# Patient Record
Sex: Female | Born: 1995 | Race: White | Hispanic: No | Marital: Single | Smoking: Never smoker
Health system: Southern US, Academic
[De-identification: ages and names within clinical notes are randomized; demographics above are authoritative.]

## PROBLEM LIST (undated history)

## (undated) DIAGNOSIS — E669 Obesity, unspecified: Secondary | ICD-10-CM

## (undated) DIAGNOSIS — R569 Unspecified convulsions: Secondary | ICD-10-CM

## (undated) DIAGNOSIS — K760 Fatty (change of) liver, not elsewhere classified: Secondary | ICD-10-CM

## (undated) DIAGNOSIS — E119 Type 2 diabetes mellitus without complications: Secondary | ICD-10-CM

## (undated) DIAGNOSIS — R7303 Prediabetes: Secondary | ICD-10-CM

## (undated) DIAGNOSIS — K829 Disease of gallbladder, unspecified: Secondary | ICD-10-CM

## (undated) HISTORY — DX: Unspecified convulsions: R56.9

## (undated) HISTORY — DX: Fatty (change of) liver, not elsewhere classified: K76.0

## (undated) HISTORY — DX: Obesity, unspecified: E66.9

## (undated) HISTORY — PX: HX TYMPANOSTOMY: SHX32

## (undated) HISTORY — PX: HX KNEE SURGERY: 2100001320

## (undated) HISTORY — PX: SLEEVE GASTROPLASTY: SHX1101

## (undated) HISTORY — PX: HX LAP CHOLECYSTECTOMY: SHX56

## (undated) HISTORY — PX: HX GALL BLADDER SURGERY/CHOLE: SHX55

## (undated) HISTORY — PX: HX TONSILLECTOMY: SHX27

## (undated) HISTORY — DX: Prediabetes: R73.03

## (undated) HISTORY — DX: Type 2 diabetes mellitus without complications (CMS HCC): E11.9

## (undated) HISTORY — PX: HX UPPER ENDOSCOPY: 2100001144

## (undated) HISTORY — DX: Disease of gallbladder, unspecified: K82.9

---
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
## 2018-08-29 IMAGING — US EXTREMITY
1 series · 14 of 19 positions shown · non-contrast
Comparison: None.

EXAM:  EXTREMITY RT CALF
INDICATION: Localized swelling, mass and lump over right lower limb.  Tender lump in the outer calf with increase in size.
TECHNIQUE: Multiplanar sonographic images of the right calf were performed.

[Series 2: extremity · 0.06mm/px · 14 of 19 slices shown]
[im 1/19]
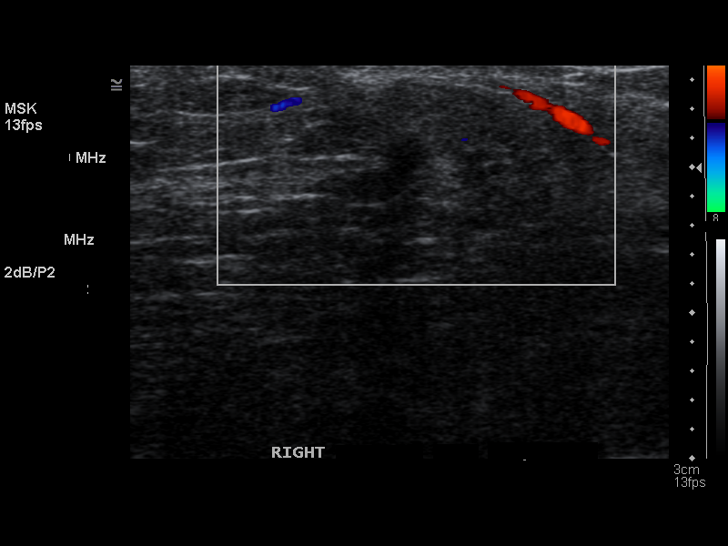
[im 3/19]
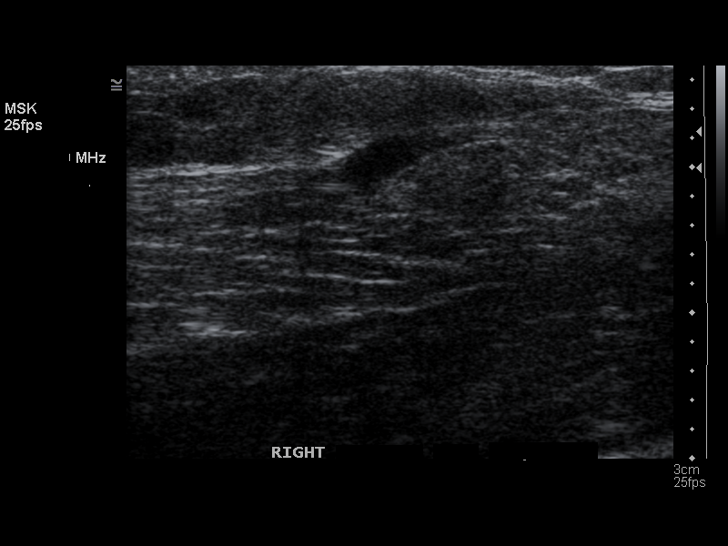
[im 4/19]
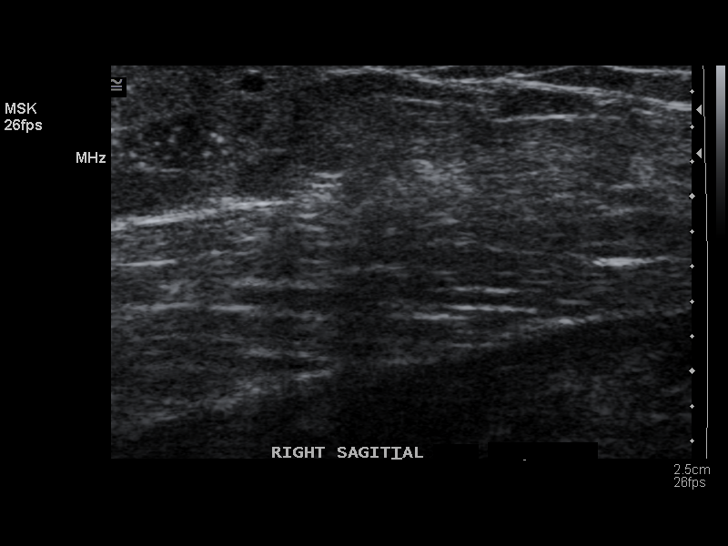
[im 5/19]
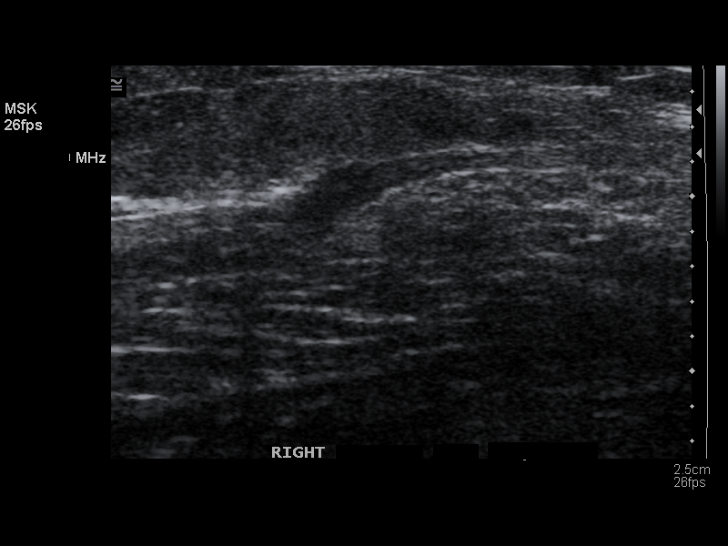
[im 7/19]
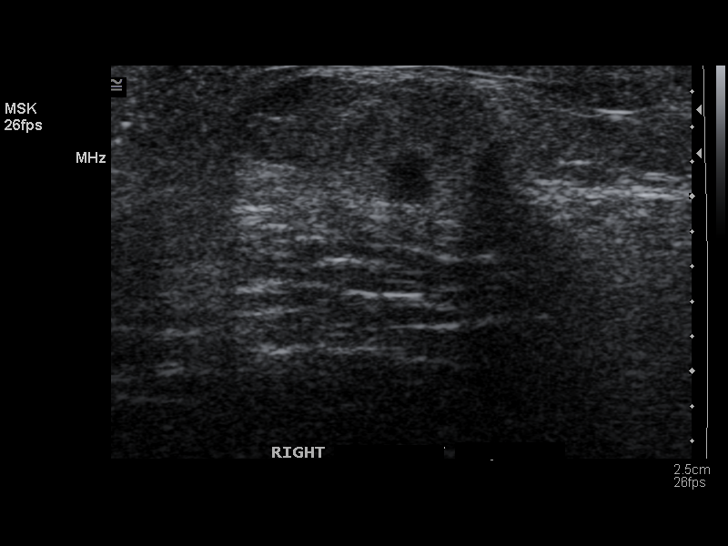
[im 8/19]
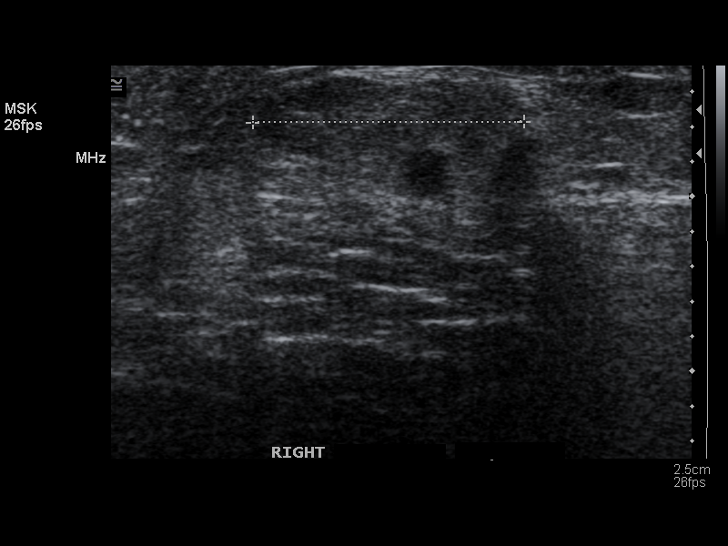
[im 9/19]
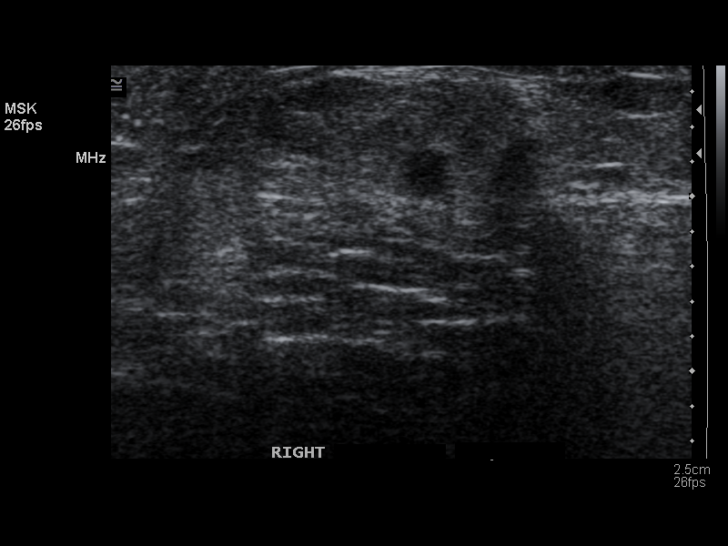
[im 11/19]
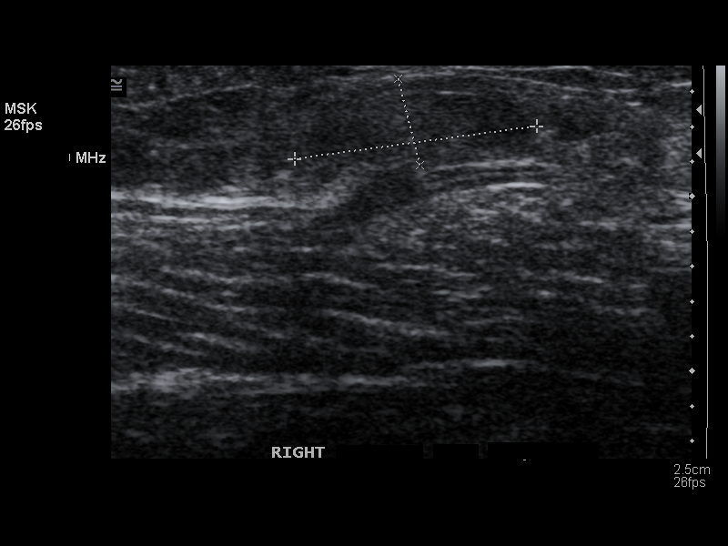
[im 12/19]
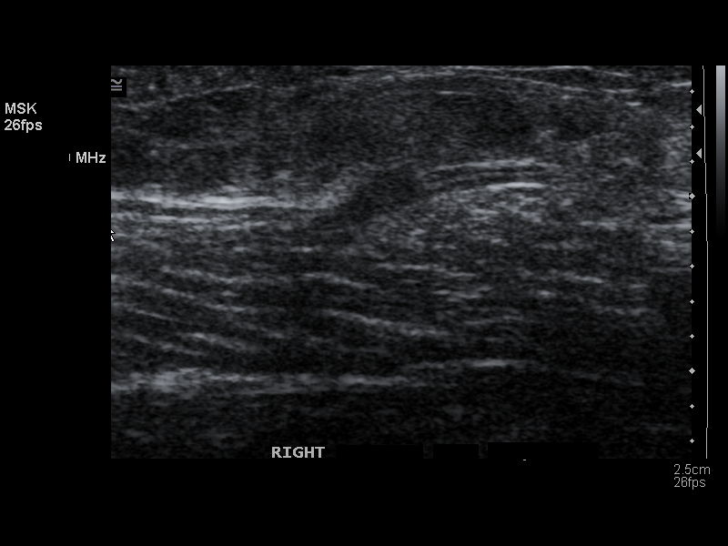
[im 13/19]
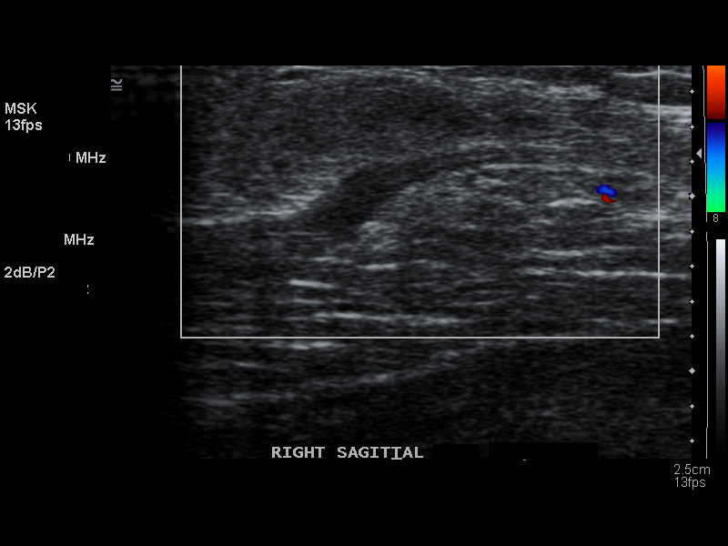
[im 15/19]
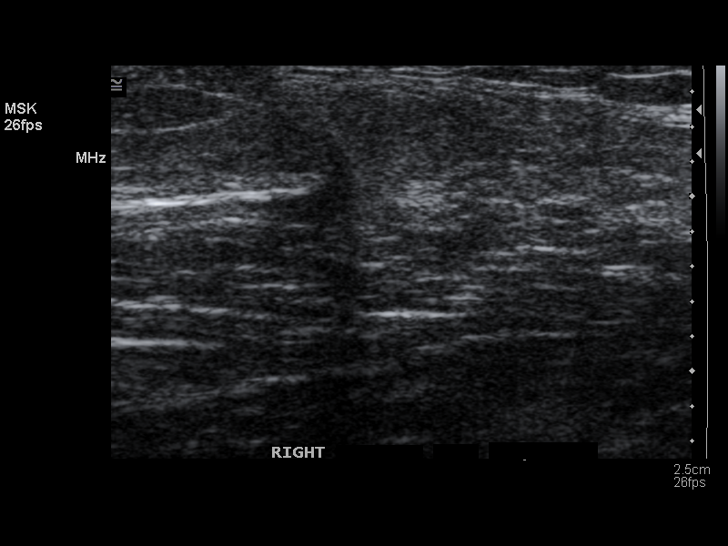
[im 16/19]
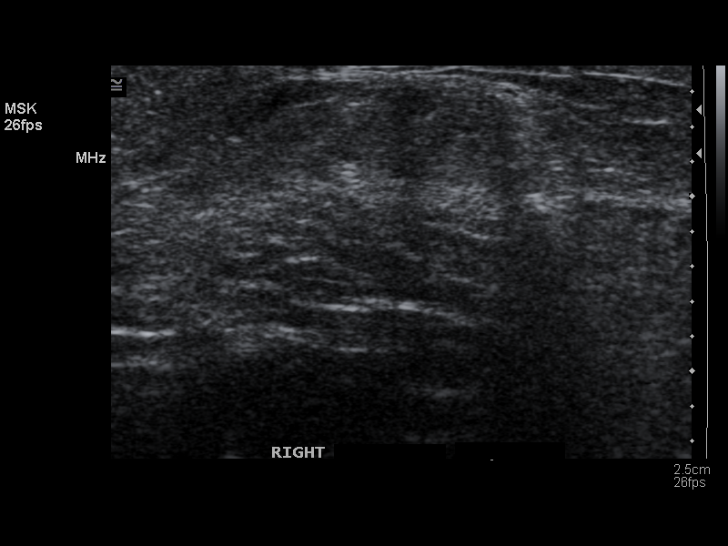
[im 17/19]
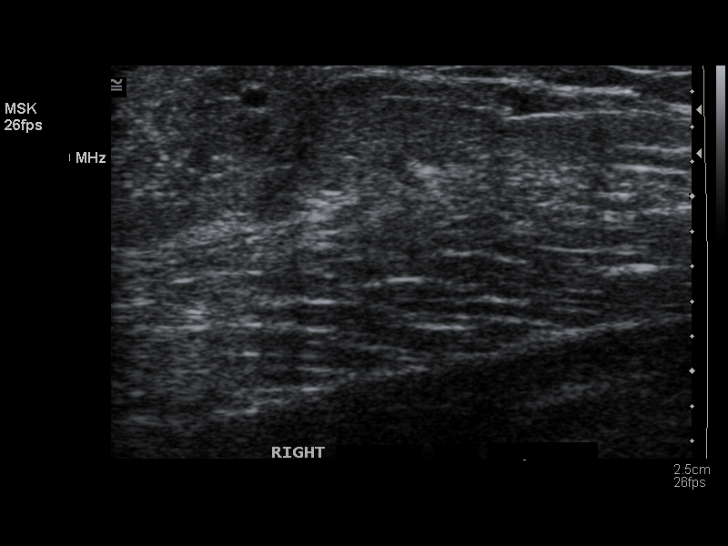
[im 19/19]
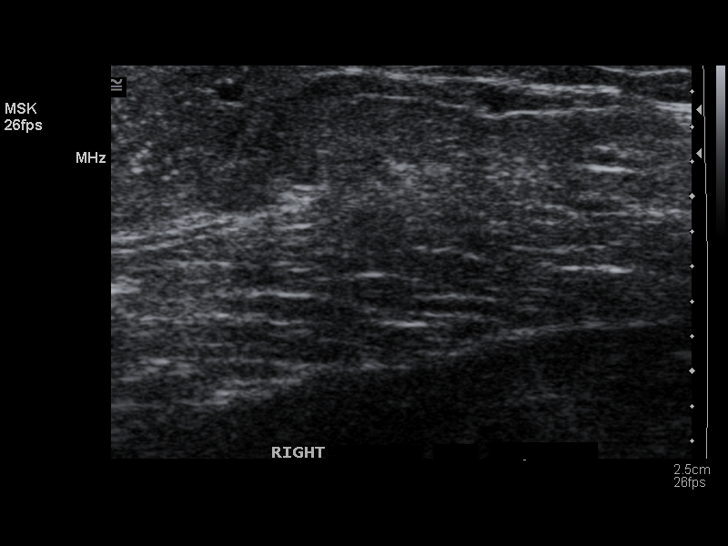

[14 of 19 positions shown; findings below may reference images not displayed]

FINDINGS: There is an isoechoic subcutaneous mass in the mid to distal calf laterally, corresponding to the palpable abnormality.  It measures 1.4 x 0.5 x 1.6 cm.  This is nonspecific and may represent a lipoma.  Lipoma was suggested on outside prior sonogram which is not available for comparison.  No other solid or cystic lesions are identified.
IMPRESSION: Nonspecific isoechoic soft tissue mass in the right calf, possibly representing a lipoma.  Correlation may be obtained with CT or MRI.

## 2019-06-24 IMAGING — MR MRI JOINT LOWER EXTREMITY WITHOUT CONTRAST RT
5 series · 40 of 40 positions shown · IV contrast (gadolinium)
Comparison: None available.

EXAM:  MRI JOINT LOWER EXTREMITY WITHOUT CONTRAST RT
INDICATION: Right knee pain, history of prior meniscal tear surgery.
TECHNIQUE: Multiplanar multisequential MRI of the right knee joint was performed without gadolinium contrast.

[Series 9: PD fat-sat · axial · right · 4.0mm · 0.59mm/px · z∈[-82,+47]mm · 8 of 30 slices shown (1 of 3)]
[im 1/30]
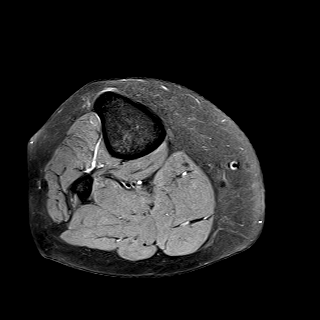
[im 5/30]
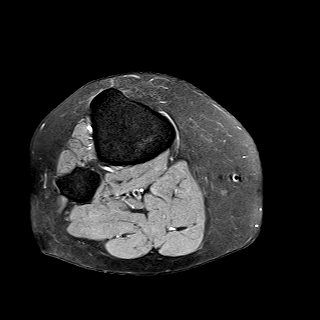
[im 9/30]
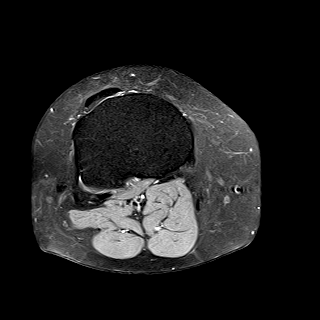
[im 13/30]
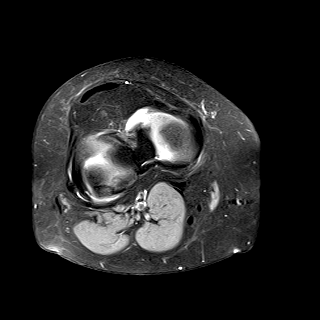
[im 17/30]
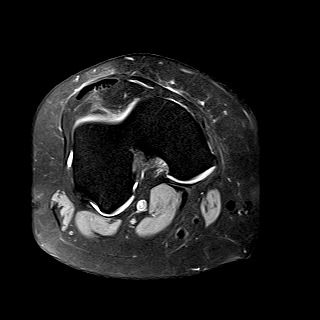
[im 21/30]
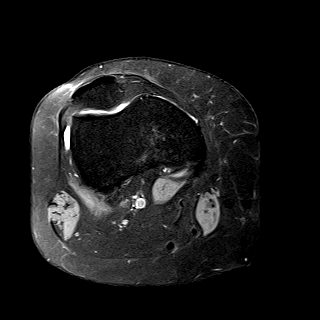
[im 25/30]
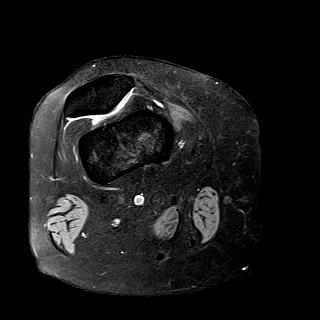
[im 30/30]
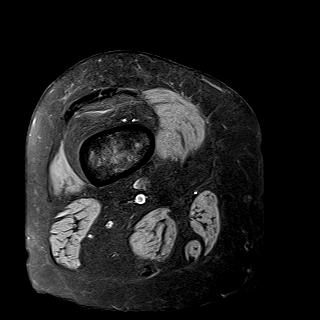

[Series 10: PD fat-sat · sagittal · right · 3.0mm · 0.56mm/px · 9 of 30 slices shown (2 of 3)]
[im 1/30]
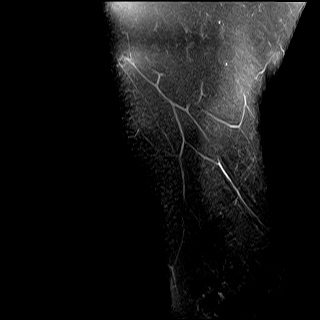
[im 4/30]
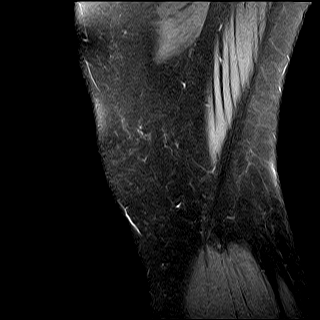
[im 8/30]
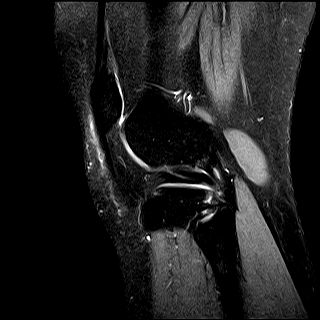
[im 11/30]
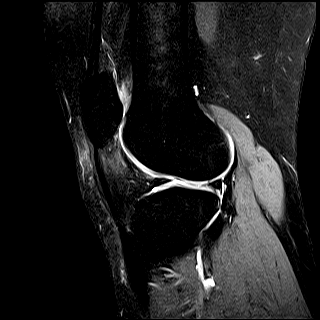
[im 15/30]
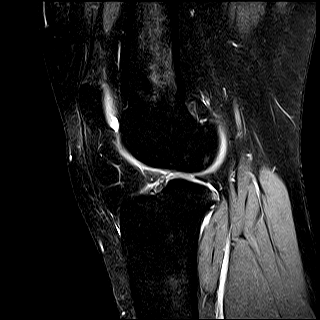
[im 19/30]
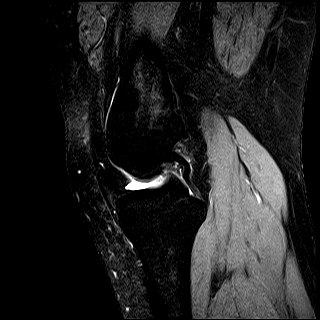
[im 22/30]
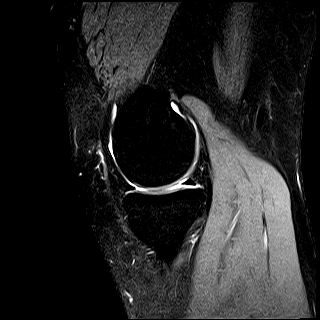
[im 26/30]
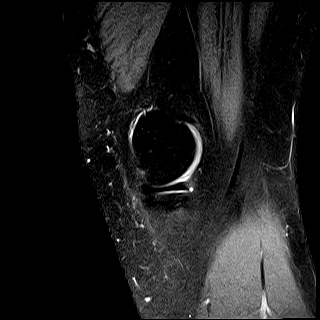
[im 30/30]
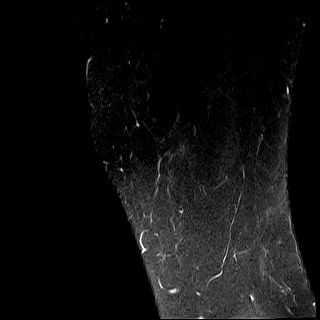

[Series 11: T1 · sagittal · right · 3.0mm · 0.47mm/px · 9 of 30 slices shown]
[im 1/30]
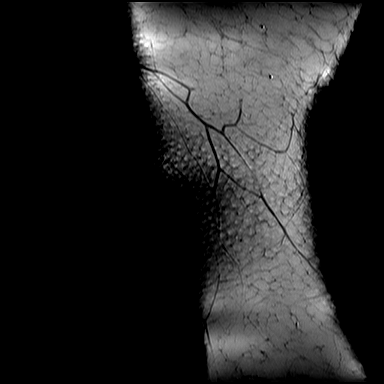
[im 4/30]
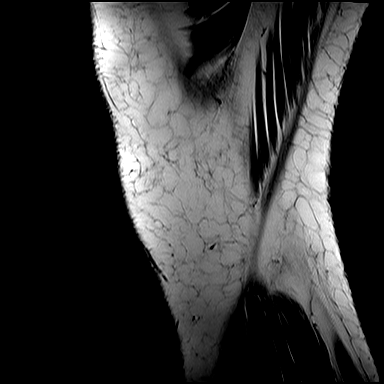
[im 8/30]
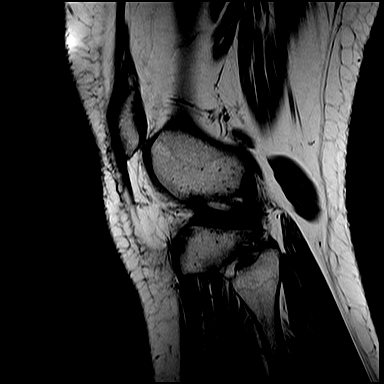
[im 11/30]
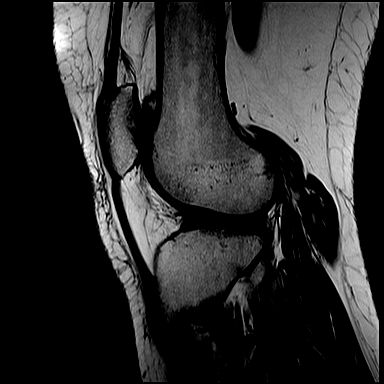
[im 15/30]
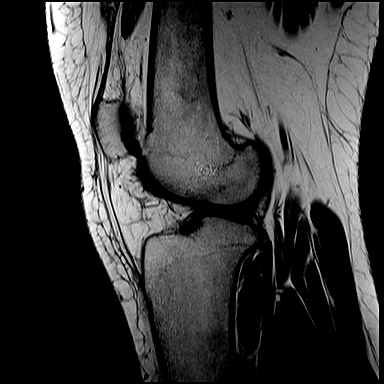
[im 19/30]
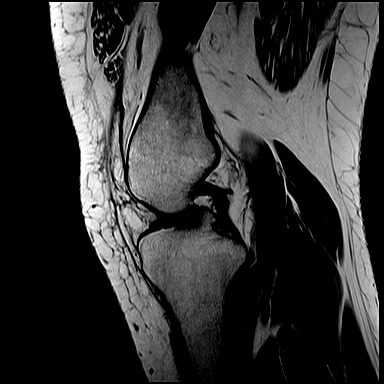
[im 22/30]
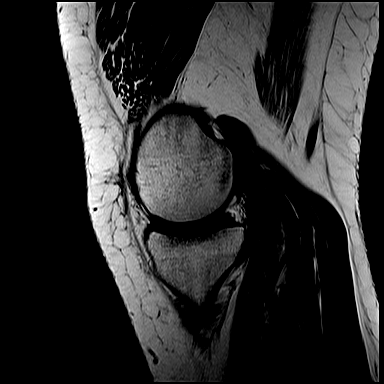
[im 26/30]
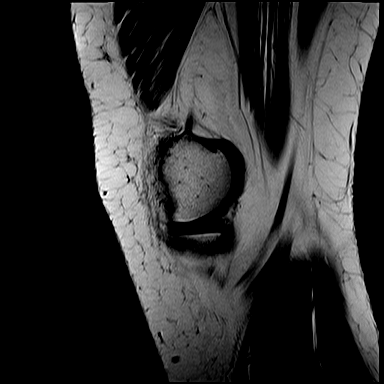
[im 30/30]
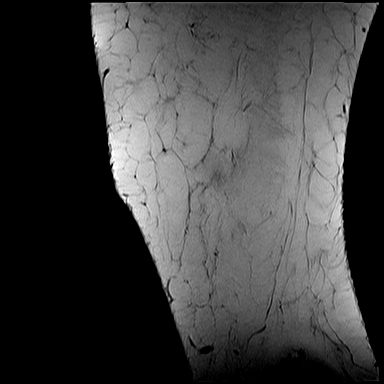

[Series 12: STIR · coronal · right · 3.0mm · 0.47mm/px · 7 of 22 slices shown]
[im 1/22]
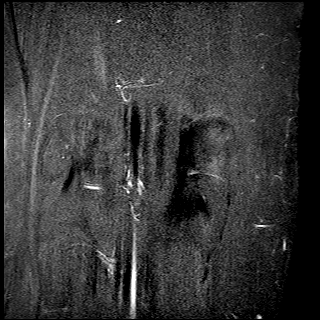
[im 4/22]
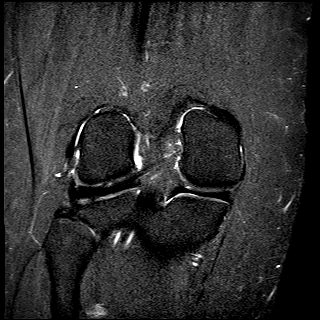
[im 8/22]
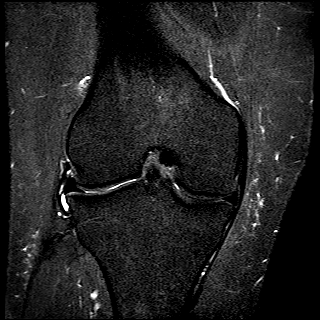
[im 11/22]
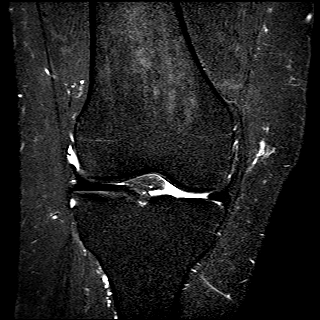
[im 15/22]
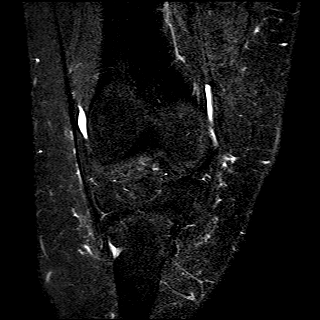
[im 18/22]
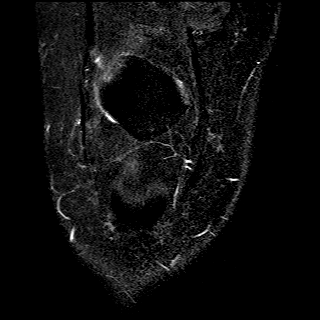
[im 22/22]
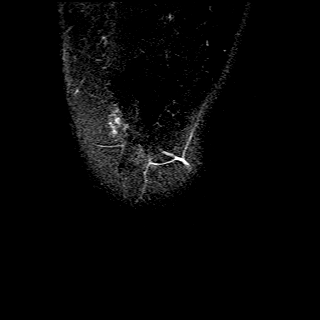

[Series 13: PD fat-sat · coronal · right · 3.0mm · 0.47mm/px · 7 of 22 slices shown (3 of 3)]
[im 1/22]
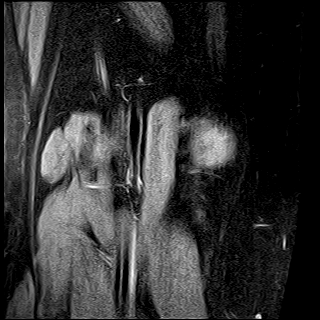
[im 4/22]
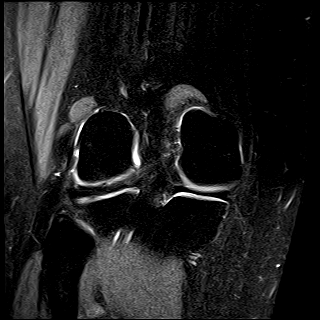
[im 8/22]
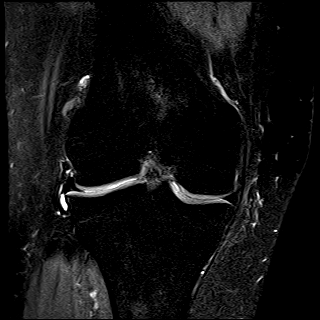
[im 11/22]
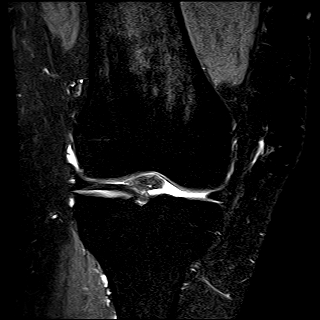
[im 15/22]
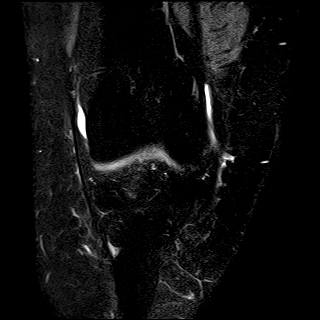
[im 18/22]
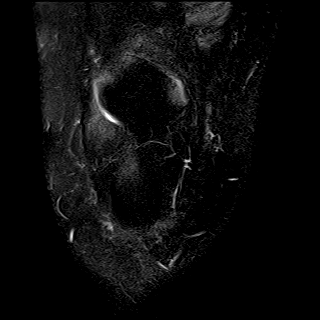
[im 22/22]
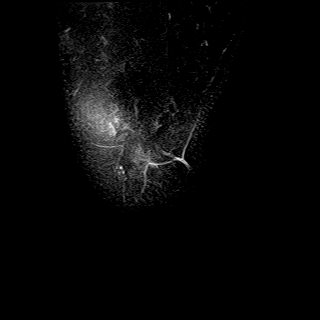

[40 of 40 positions shown; findings below may reference images not displayed]

FINDINGS: There is a subtle deformity involving the body of the lateral meniscus. Medial meniscus, cruciate and collateral ligaments are intact, within normal limits in morphology and signal intensity. Hyaline cartilage of the tibiofemoral and patellofemoral articulations is well maintained. Extensor mechanism is intact. Capsular attachments appear unremarkable. Bone marrow signal intensity is normal. There is no suprapatellar effusion or Baker's cyst.
IMPRESSION: Subtle deformity involving the body of the lateral meniscus. A remote injury and repair should be excluded clinically.

## 2020-10-05 ENCOUNTER — Encounter (INDEPENDENT_AMBULATORY_CARE_PROVIDER_SITE_OTHER): Payer: Self-pay

## 2021-04-29 IMAGING — MR MRI KNEE RT W/O CONTRAST
4 of 5 series · 24 of 40 positions shown · IV contrast (gadolinium)
Comparison: MRI dated 06/24/2019.

﻿EXAM:  19[address]   MRI KNEE RT W/O CONTRAST
INDICATION: New onset pain.  History of prior surgery.
TECHNIQUE: Multiplanar, multisequential MRI of the right knee joint was performed without gadolinium contrast.

[Series 5: PD fat-sat · axial · right · 4.0mm · 0.37mm/px · z∈[-30,+101]mm · 8 of 30 slices shown (1 of 3)]
[im 1/30]
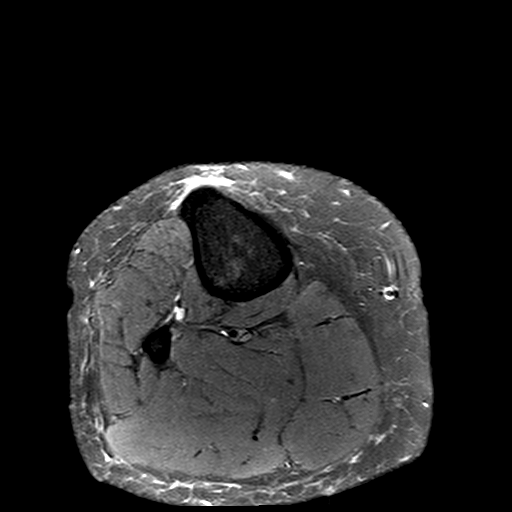
[im 5/30]
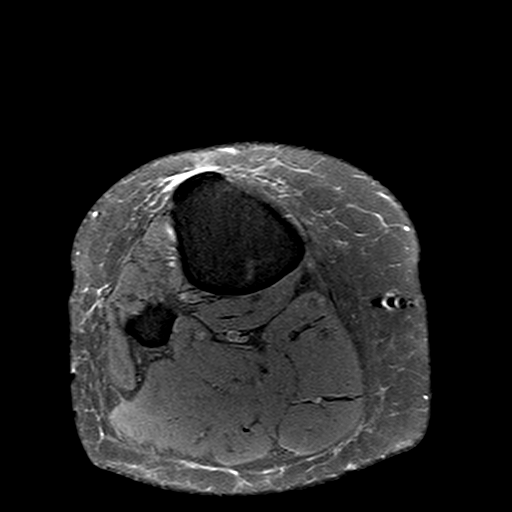
[im 9/30]
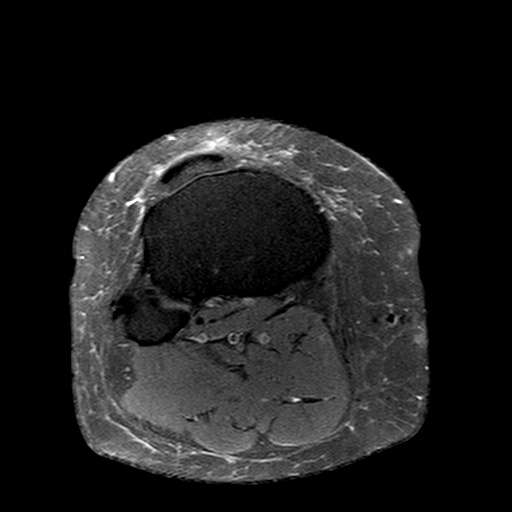
[im 13/30]
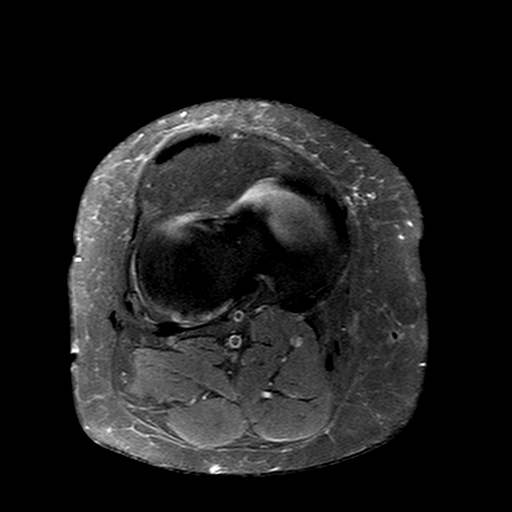
[im 17/30]
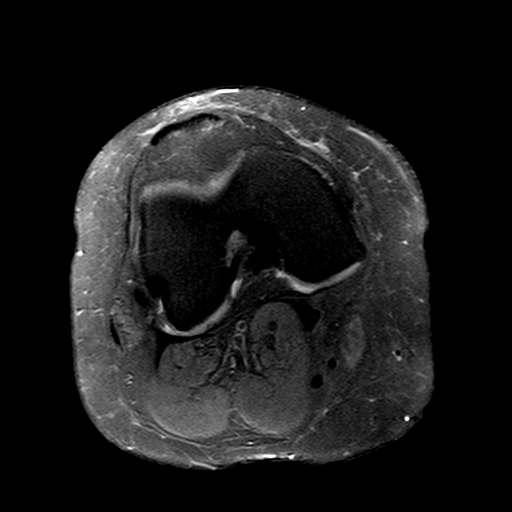
[im 21/30]
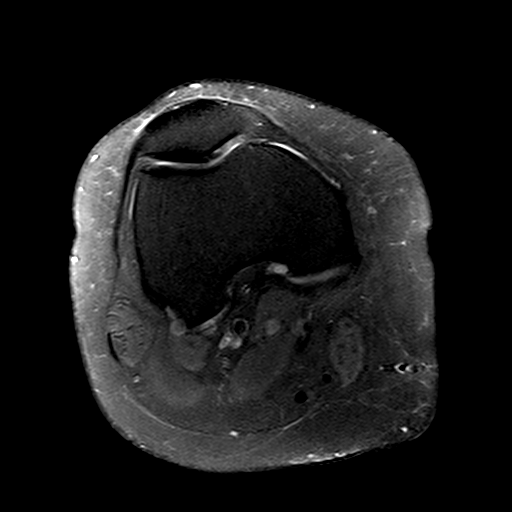
[im 25/30]
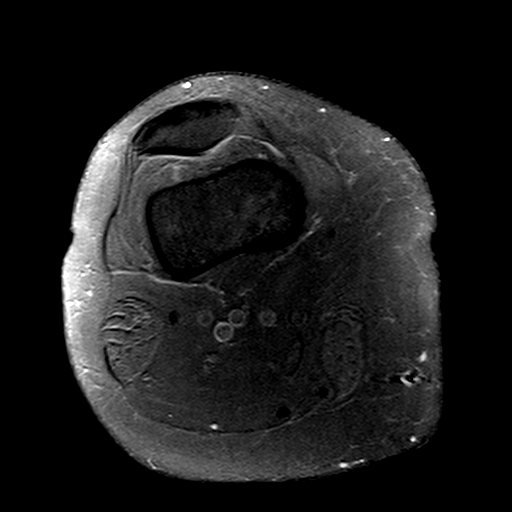
[im 30/30]
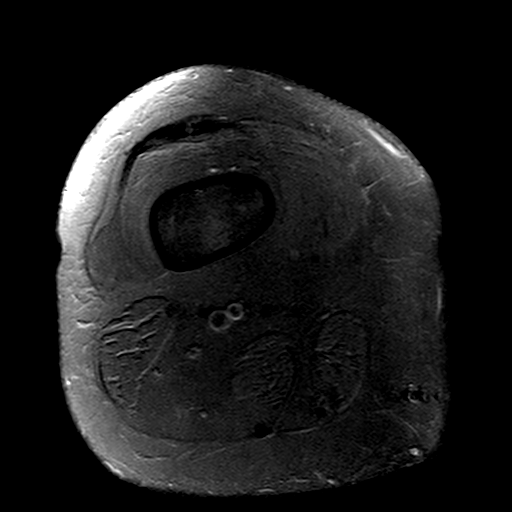

[Series 6: PD fat-sat · sagittal · right · 3.0mm · 0.31mm/px · 8 of 30 slices shown (2 of 3)]
[im 1/30]
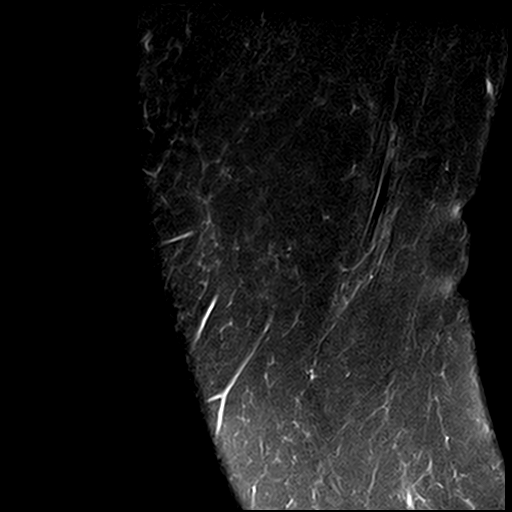
[im 5/30]
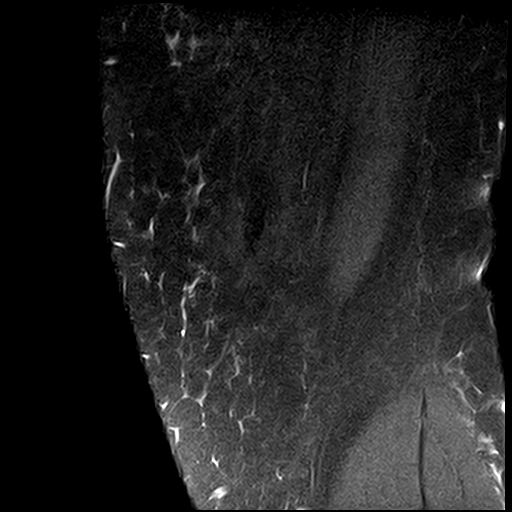
[im 9/30]
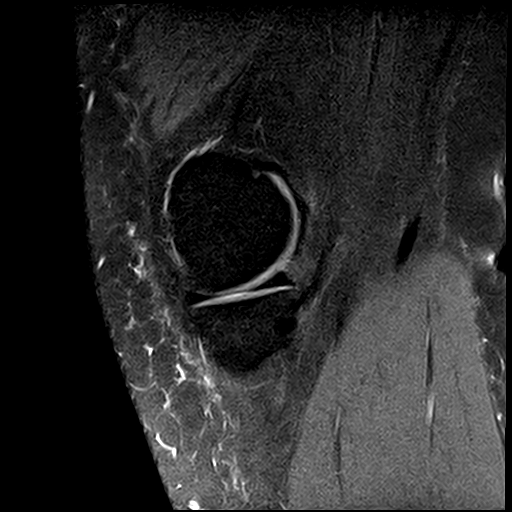
[im 13/30]
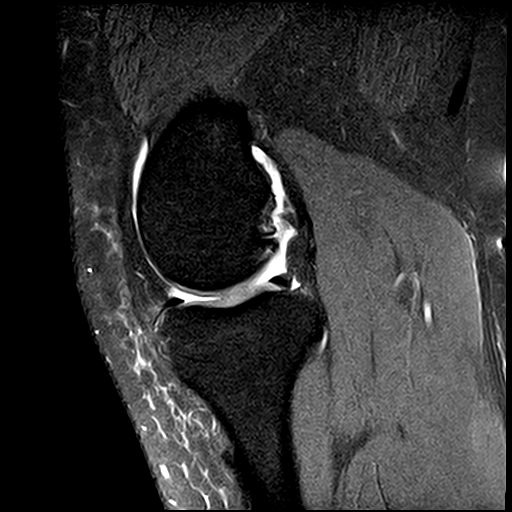
[im 17/30]
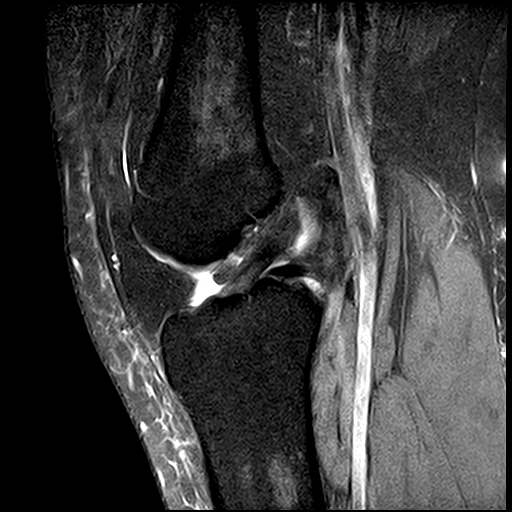
[im 21/30]
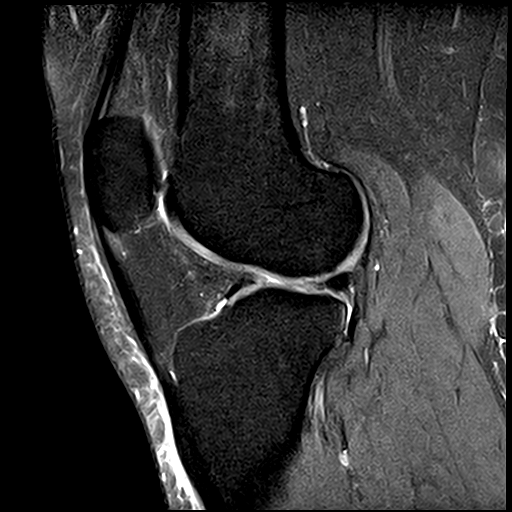
[im 25/30]
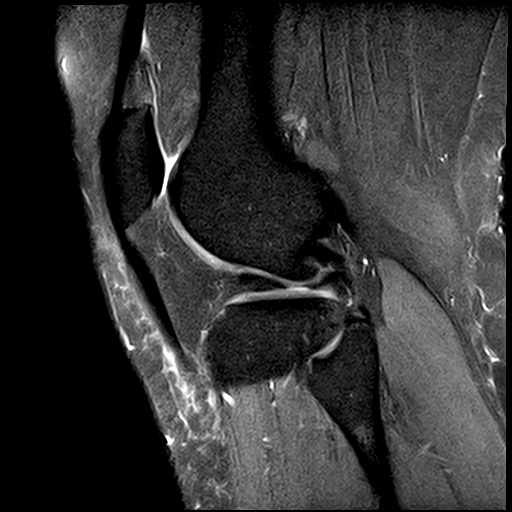
[im 30/30]
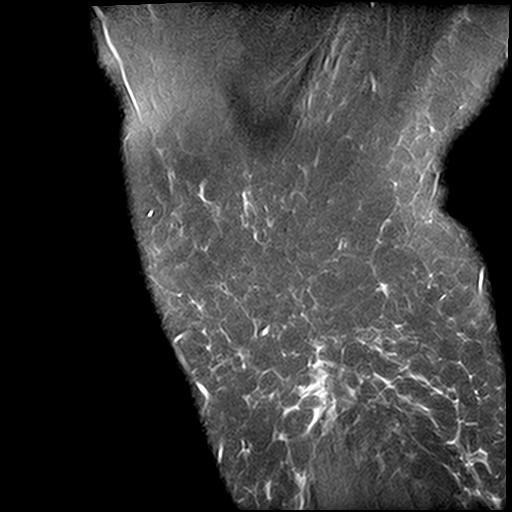

[Series 8: T1 · sagittal · right · 3.0mm · 0.42mm/px · 3 of 30 slices shown]
[im 5/30]
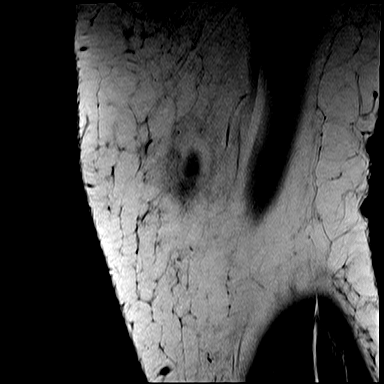
[im 17/30]
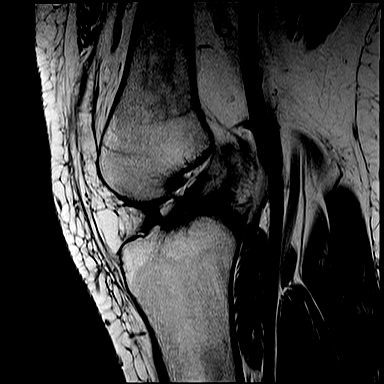
[im 25/30]
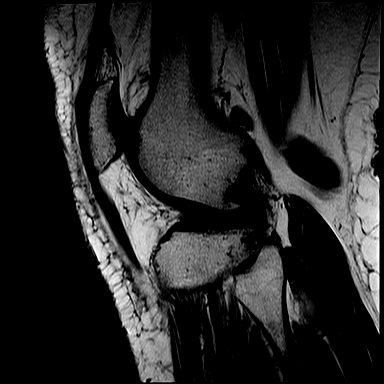

[Series 9: PD fat-sat · coronal · right · 3.0mm · 0.47mm/px · 5 of 30 slices shown (3 of 3)]
[im 1/30]
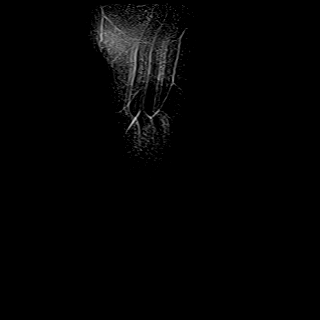
[im 5/30]
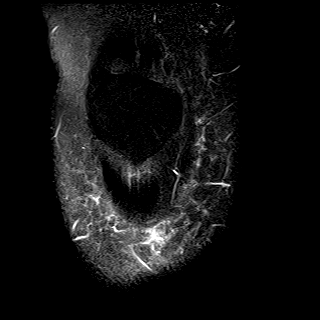
[im 9/30]
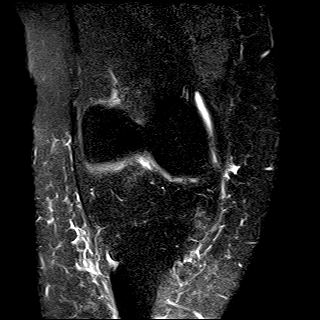
[im 17/30]
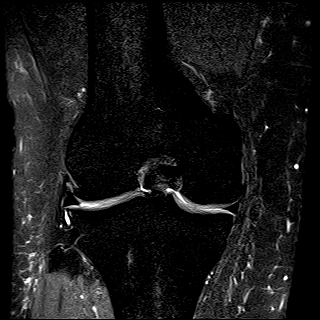
[im 25/30]
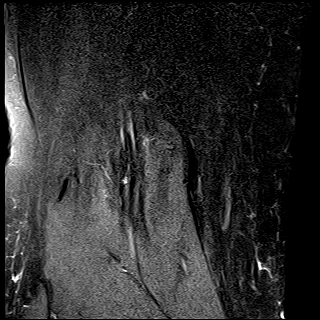

[24 of 40 positions shown; findings below may reference images not displayed]

FINDINGS: There is a horizontal tear involving the posterior horn of the lateral meniscus.  Medial meniscus, cruciate and collateral ligaments are intact, within normal limits in morphology and signal intensity.  Hyaline cartilage of the tibiofemoral and patellofemoral articulations is well-maintained.  Extensor mechanism is intact.  Capsular attachments appear unremarkable.  Bone marrow signal intensity is normal.  There is no suprapatellar effusion or Baker's cyst.
IMPRESSION: Horizontal tear involving the posterior horn of the lateral meniscus.

## 2021-10-26 ENCOUNTER — Emergency Department
Admission: EM | Admit: 2021-10-26 | Discharge: 2021-10-26 | Disposition: A | Discharge status: Home / Self Care | Payer: BC Managed Care – PPO | Attending: Family | Admitting: Family

## 2021-10-26 ENCOUNTER — Emergency Department (EMERGENCY_DEPARTMENT_HOSPITAL): Payer: BC Managed Care – PPO

## 2021-10-26 ENCOUNTER — Other Ambulatory Visit: Payer: Self-pay

## 2021-10-26 DIAGNOSIS — I10 Essential (primary) hypertension: Secondary | ICD-10-CM | POA: Insufficient documentation

## 2021-10-26 DIAGNOSIS — R519 Headache, unspecified: Secondary | ICD-10-CM

## 2021-10-26 DIAGNOSIS — Z8669 Personal history of other diseases of the nervous system and sense organs: Secondary | ICD-10-CM

## 2021-10-26 DIAGNOSIS — G510 Bell's palsy: Secondary | ICD-10-CM | POA: Insufficient documentation

## 2021-10-26 DIAGNOSIS — G43909 Migraine, unspecified, not intractable, without status migrainosus: Secondary | ICD-10-CM

## 2021-10-26 DIAGNOSIS — Z20822 Contact with and (suspected) exposure to covid-19: Secondary | ICD-10-CM | POA: Insufficient documentation

## 2021-10-26 DIAGNOSIS — H539 Unspecified visual disturbance: Secondary | ICD-10-CM

## 2021-10-26 LAB — URINALYSIS, MACRO/MICRO
BILIRUBIN: NEGATIVE mg/dL
BLOOD: NEGATIVE mg/dL
GLUCOSE: NEGATIVE mg/dL
KETONES: NEGATIVE mg/dL
LEUKOCYTES: NEGATIVE WBCs/uL
NITRITE: NEGATIVE
PH: 7 (ref 4.6–8.0)
PROTEIN: NEGATIVE mg/dL
SPECIFIC GRAVITY: 1.01 (ref 1.003–1.035)
UROBILINOGEN: 0.2 mg/dL (ref 0.2–1.0)

## 2021-10-26 LAB — COVID-19, FLU A/B, RSV RAPID BY PCR
INFLUENZA VIRUS TYPE A: NOT DETECTED
INFLUENZA VIRUS TYPE B: NOT DETECTED
RESPIRATORY SYNCTIAL VIRUS (RSV): NOT DETECTED
SARS-CoV-2: NOT DETECTED

## 2021-10-26 LAB — COMPREHENSIVE METABOLIC PANEL, NON-FASTING
ALBUMIN/GLOBULIN RATIO: 0.8 (ref 0.8–1.4)
ALBUMIN: 3.6 g/dL (ref 3.4–5.0)
ALKALINE PHOSPHATASE: 87 U/L (ref 46–116)
ALT (SGPT): 39 U/L (ref <=78)
ANION GAP: 11 mmol/L (ref 10–20)
AST (SGOT): 23 U/L (ref 15–37)
BILIRUBIN TOTAL: 0.5 mg/dL (ref 0.2–1.0)
BUN/CREA RATIO: 14
BUN: 10 mg/dL (ref 7–18)
CALCIUM, CORRECTED: 9.4 mg/dL
CALCIUM: 9 mg/dL (ref 8.5–10.1)
CHLORIDE: 102 mmol/L (ref 98–107)
CO2 TOTAL: 26 mmol/L (ref 21–32)
CREATININE: 0.71 mg/dL (ref 0.55–1.02)
ESTIMATED GFR: 122 mL/min/{1.73_m2} (ref >59)
GLOBULIN: 4.3
GLUCOSE: 88 mg/dL (ref 74–106)
OSMOLALITY, CALCULATED: 276 mOsm/kg (ref 270–290)
POTASSIUM: 4 mmol/L (ref 3.5–5.1)
PROTEIN TOTAL: 7.9 g/dL (ref 6.4–8.2)
SODIUM: 139 mmol/L (ref 136–145)

## 2021-10-26 LAB — CBC WITH DIFF
BASOPHIL #: 0.04 10*3/uL (ref 0.00–2.50)
BASOPHIL %: 0 % (ref 0–3)
EOSINOPHIL #: 0.14 10*3/uL (ref 0.00–2.40)
EOSINOPHIL %: 1 % (ref 0–7)
HCT: 38 % (ref 37.0–47.0)
HGB: 13.3 g/dL (ref 12.5–16.0)
LYMPHOCYTE #: 2.83 10*3/uL (ref 2.10–11.00)
LYMPHOCYTE %: 30 % (ref 25–45)
MCH: 29.4 pg (ref 27.0–32.0)
MCHC: 35 g/dL (ref 32.0–36.0)
MCV: 84.1 fL (ref 78.0–99.0)
MONOCYTE #: 0.54 10*3/uL (ref 0.00–4.10)
MONOCYTE %: 6 % (ref 0–12)
MPV: 6.9 fL — ABNORMAL LOW (ref 7.4–10.4)
NEUTROPHIL #: 5.79 10*3/uL (ref 4.10–29.00)
NEUTROPHIL %: 62 % (ref 40–76)
PLATELETS: 326 10*3/uL (ref 140–440)
RBC: 4.52 10*6/uL (ref 4.20–5.40)
RDW: 16.2 % — ABNORMAL HIGH (ref 11.6–14.8)
WBC: 9.3 10*3/uL (ref 4.0–10.5)

## 2021-10-26 LAB — HCG QUALITATIVE PREGNANCY, SERUM: PREGNANCY, SERUM QUALITATIVE: NEGATIVE

## 2021-10-26 LAB — C-REACTIVE PROTEIN (CRP): C-REACTIVE PROTEIN (CRP): 1.3 mg/dL — ABNORMAL HIGH (ref <=0.8)

## 2021-10-26 MED ORDER — SODIUM CHLORIDE 0.9 % IV BOLUS
1000.0000 mL | INJECTION | Status: AC
Start: 2021-10-26 — End: 2021-10-26
  Administered 2021-10-26: 1000 mL via INTRAVENOUS

## 2021-10-26 MED ORDER — PROCHLORPERAZINE EDISYLATE 10 MG/2 ML (5 MG/ML) INJECTION SOLUTION
5.0000 mg | INTRAMUSCULAR | Status: AC
Start: 2021-10-26 — End: 2021-10-26
  Administered 2021-10-26: 5 mg via INTRAVENOUS

## 2021-10-26 MED ORDER — LABETALOL 20 MG/4 ML (5 MG/ML) INTRAVENOUS SYRINGE
5.0000 mg | INJECTION | INTRAVENOUS | Status: DC
Start: 2021-10-26 — End: 2021-10-26

## 2021-10-26 MED ORDER — KETOROLAC 30 MG/ML (1 ML) INJECTION SOLUTION
INTRAMUSCULAR | Status: AC
Start: 2021-10-26 — End: 2021-10-26
  Filled 2021-10-26: qty 1

## 2021-10-26 MED ORDER — PROCHLORPERAZINE EDISYLATE 10 MG/2 ML (5 MG/ML) INJECTION SOLUTION
INTRAMUSCULAR | Status: AC
Start: 2021-10-26 — End: 2021-10-26
  Filled 2021-10-26: qty 2

## 2021-10-26 MED ORDER — LABETALOL 20 MG/4 ML (5 MG/ML) INTRAVENOUS SYRINGE
5.0000 mg | INJECTION | INTRAVENOUS | Status: AC
Start: 2021-10-26 — End: 2021-10-26
  Administered 2021-10-26: 5 mg via INTRAVENOUS

## 2021-10-26 MED ORDER — METOPROLOL SUCCINATE ER 25 MG TABLET,EXTENDED RELEASE 24 HR
25.0000 mg | ORAL_TABLET | Freq: Every day | ORAL | 4 refills | Status: DC
Start: 2021-10-26 — End: 2022-04-26

## 2021-10-26 MED ORDER — SODIUM CHLORIDE 0.9 % (FLUSH) INJECTION SYRINGE
3.0000 mL | INJECTION | Freq: Three times a day (TID) | INTRAMUSCULAR | Status: DC
Start: 2021-10-26 — End: 2021-10-26

## 2021-10-26 MED ORDER — PROCHLORPERAZINE MALEATE 10 MG TABLET
10.0000 mg | ORAL_TABLET | Freq: Four times a day (QID) | ORAL | 0 refills | Status: AC | PRN
Start: 2021-10-26 — End: 2021-11-02

## 2021-10-26 MED ORDER — SODIUM CHLORIDE 0.9 % (FLUSH) INJECTION SYRINGE
3.0000 mL | INJECTION | INTRAMUSCULAR | Status: DC | PRN
Start: 2021-10-26 — End: 2021-10-26

## 2021-10-26 MED ORDER — LABETALOL 20 MG/4 ML (5 MG/ML) INTRAVENOUS SYRINGE
INJECTION | INTRAVENOUS | Status: AC
Start: 2021-10-26 — End: 2021-10-26
  Filled 2021-10-26: qty 4

## 2021-10-26 MED ORDER — KETOROLAC 30 MG/ML (1 ML) INJECTION SOLUTION
30.0000 mg | INTRAMUSCULAR | Status: AC
Start: 2021-10-26 — End: 2021-10-26
  Administered 2021-10-26: 30 mg via INTRAVENOUS

## 2021-10-26 MED ORDER — BUTALBITAL-ACETAMINOPHEN-CAFFEINE 50 MG-300 MG-40 MG CAPSULE
1.0000 | ORAL_CAPSULE | ORAL | 0 refills | Status: DC
Start: 2021-10-26 — End: 2022-03-15

## 2021-10-26 MED ORDER — LABETALOL 20 MG/4 ML (5 MG/ML) INTRAVENOUS SYRINGE
10.0000 mg | INJECTION | INTRAVENOUS | Status: DC
Start: 2021-10-26 — End: 2021-10-26

## 2021-10-26 NOTE — Discharge Instructions (Signed)
Very important to take BP meds as directed to present strokes, heart disease, kidney failure, and early death. The BP med may help reduce  migraines as well.

## 2021-10-26 NOTE — ED Triage Notes (Signed)
Pt complains of having a headache and elevated blood pressure since Monday.

## 2021-10-26 NOTE — ED Provider Notes (Signed)
[facility], [facility] Emergency Department  ED Primary Provider Note  History of Present Illness   Chief Complaint   Patient presents with   . Headache     Arrival: The patient arrived by Car    Sabrina Morgan is a 26 y.o. female who had concerns including Headache.  Constant headache for a week. 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 BP was 240/120 this am. Out of BP meds for years. Use to be on HCTZ, lisinopril.     Review of Systems   Constitutional: No fever, chills or weakness   Skin: No rash or diaphoresis  HENT: No headaches, or congestion  Eyes: No vision changes or photophobia   Cardio: No chest pain, palpitations or leg swelling   Respiratory: No cough, wheezing or SOB  GI:  + nausea,  No vomiting or stool changes  GU:  No dysuria, hematuria, or increased frequency  MSK: No muscle aches, joint or back pain  Neuro: No seizures, LOC, numbness, tingling, or focal weakness, severe left temporal headache.   Psychiatric: No depression, SI or substance abuse  All other systems reviewed and are negative.    Historical Data   History Reviewed This Encounter:  All reviewed and noted.     Physical Exam   ED Triage Vitals [10/26/21 1255]   BP (Non-Invasive) (!) 154/98   Heart Rate 73   Respiratory Rate 18   Temperature 37 C (98.6 F)   SpO2 98 %   Weight (!) 147 kg (324 lb 1.2 oz)   Height 1.829 m (6')       Constitutional:  26 y.o. female who appears in no distress. Normal color, no cyanosis.   HENT:   Head: Normocephalic and atraumatic.   Mouth/Throat: Oropharynx is clear and moist.   Eyes: EOMI, PERRL   Neck: Trachea midline. Neck supple.  Cardiovascular: RRR, No murmurs, rubs or gallops. Intact distal pulses.  Pulmonary/Chest: BS equal bilaterally. No respiratory distress. No wheezes, rales or chest tenderness.   Abdominal: Bowel sounds present and normal. Abdomen soft, no tenderness, no rebound and no guarding.  Back: No midline spinal  tenderness, no paraspinal tenderness, no CVA tenderness.           Musculoskeletal: No edema, tenderness or deformity.  Skin: warm and dry. No rash, erythema, pallor or cyanosis  Psychiatric: normal mood and affect. Behavior is normal.   Neurological: Patient keenly alert and responsive, easily able to raise eyebrows, facial muscles/expressions symmetric, speaking in fluent sentences, moving all extremities equally and fully, normal gait  Patient Data     Labs Ordered/Reviewed   C-REACTIVE PROTEIN (CRP) - Abnormal; Notable for the following components:       Result Value    C-REACTIVE PROTEIN (CRP) 1.3 (*)     All other components within normal limits   CBC WITH DIFF - Abnormal; Notable for the following components:    RDW 16.2 (*)     MPV 6.9 (*)     All other components within normal limits   HCG QUALITATIVE PREGNANCY, SERUM - Normal   COVID-19, FLU A/B, RSV RAPID BY PCR - Normal    Narrative:     Results are for the simultaneous qualitative identification of SARS-CoV-2 (formerly 2019-nCoV), Influenza A, Influenza B, and RSV RNA. These etiologic agents are generally detectable in nasopharyngeal and nasal swabs during the ACUTE PHASE of infection. Hence, this test is intended to be  performed on respiratory specimens collected from individuals with signs and symptoms of upper respiratory tract infection who meet Centers for Disease Control and Prevention (CDC) clinical and/or epidemiological criteria for Coronavirus Disease 2019 (COVID-19) testing. CDC COVID-19 criteria for testing on human specimens is available at [facility] webpage information for Healthcare Professionals: Coronavirus Disease 2019 (COVID-19) ([email or web address]).     False-negative results may occur if the virus has genomic mutations, insertions, deletions, or rearrangements or if performed very early in the course of illness. Otherwise, negative results indicate virus specific RNA targets are not detected,  however negative results do not preclude SARS-CoV-2 infection/COVID-19, Influenza, or Respiratory syncytial virus infection. Results should not be used as the sole basis for patient management decisions. Negative results must be combined with clinical observations, patient history, and epidemiological information. If upper respiratory tract infection is still suspected based on exposure history together with other clinical findings, re-testing should be considered.    Disclaimer:   This assay has been authorized by FDA under an Emergency Use Authorization for use in laboratories certified under the Clinical Laboratory Improvement Amendments of 1988 (CLIA), 42 U.S.C. ([phone], to perform high complexity tests. The impacts of vaccines, antiviral therapeutics, antibiotics, chemotherapeutic or immunosuppressant drugs have not been evaluated.     Test methodology:   Cepheid Xpert Xpress SARS-CoV-2/Flu/RSV Assay real-time polymerase chain reaction (RT-PCR) test on the GeneXpert Dx and Xpert Xpress systems.   URINALYSIS, MACRO/MICRO - Normal   CBC/DIFF    Narrative:     The following orders were created for panel order CBC/DIFF.  Procedure                               Abnormality         Status                     ---------                               -----------         ------                     CBC WITH DIFF[[id number]]                Abnormal            Final result                 Please view results for these tests on the individual orders.   COMPREHENSIVE METABOLIC PANEL, NON-FASTING    Narrative:     Estimated Glomerular Filtration Rate (eGFR) is calculated using the CKD-EPI (2021) equation, intended for patients 9 years of age and older. If gender is not documented or "unknown", there will be no eGFR calculation.   URINALYSIS WITH REFLEX MICROSCOPIC AND CULTURE IF POSITIVE    Narrative:     The following orders were created for panel order URINALYSIS WITH REFLEX MICROSCOPIC AND CULTURE IF POSITIVE.  Procedure                                Abnormality         Status                     ---------                               -----------         ------  URINALYSIS, MACRO/MICRO[[id number]]      Normal              Final result                 Please view results for these tests on the individual orders.     CT BRAIN WO IV CONTRAST   Final Result by Edi, Radresults In (03/08 1416)   No acute intracranial abnormality.         One or more dose reduction techniques were used (e.g., Automated exposure control, adjustment of the mA and/or kV according to patient size, use of iterative reconstruction technique).         Radiologist location ID: [facility] Decision Making       MDM sx  Improved. Consideration of TIA hypertensive urgency. Migraine cluter headache.   ED Course as of 10/26/21 1441   Wed Oct 26, 2021   1421 149/66         Medications Administered in the ED   NS flush syringe (has no administration in time range)   NS flush syringe (has no administration in time range)   prochlorperazine (COMPAZINE) 5 mg/mL injection (5 mg Intravenous Given 10/26/21 1327)   ketorolac (TORADOL) 30 mg/mL injection (30 mg Intravenous Given 10/26/21 1328)   NS bolus infusion 1,000 mL (1,000 mL Intravenous New Bag/New Syringe 10/26/21 1327)   labetalol (TRANDATE) 5 mg/mL injection (5 mg Intravenous Given 10/26/21 1328)     Clinical Impression   Headache (Primary)   Uncontrolled hypertension       Disposition: Discharged

## 2022-03-15 ENCOUNTER — Emergency Department
Admission: EM | Admit: 2022-03-15 | Discharge: 2022-03-15 | Disposition: A | Discharge status: Home / Self Care | Payer: BC Managed Care – PPO | Attending: FAMILY PRACTICE | Admitting: FAMILY PRACTICE

## 2022-03-15 ENCOUNTER — Other Ambulatory Visit: Payer: Self-pay

## 2022-03-15 ENCOUNTER — Emergency Department (HOSPITAL_BASED_OUTPATIENT_CLINIC_OR_DEPARTMENT_OTHER): Payer: BC Managed Care – PPO

## 2022-03-15 ENCOUNTER — Encounter (HOSPITAL_BASED_OUTPATIENT_CLINIC_OR_DEPARTMENT_OTHER): Payer: Self-pay | Admitting: FAMILY PRACTICE

## 2022-03-15 DIAGNOSIS — R9431 Abnormal electrocardiogram [ECG] [EKG]: Secondary | ICD-10-CM | POA: Insufficient documentation

## 2022-03-15 DIAGNOSIS — R634 Abnormal weight loss: Secondary | ICD-10-CM | POA: Insufficient documentation

## 2022-03-15 DIAGNOSIS — N920 Excessive and frequent menstruation with regular cycle: Secondary | ICD-10-CM

## 2022-03-15 DIAGNOSIS — R519 Headache, unspecified: Secondary | ICD-10-CM | POA: Insufficient documentation

## 2022-03-15 DIAGNOSIS — I10 Essential (primary) hypertension: Secondary | ICD-10-CM

## 2022-03-15 DIAGNOSIS — H8110 Benign paroxysmal vertigo, unspecified ear: Secondary | ICD-10-CM

## 2022-03-15 LAB — COMPREHENSIVE METABOLIC PANEL, NON-FASTING
ALBUMIN/GLOBULIN RATIO: 0.8 (ref 0.8–1.4)
ALBUMIN: 3.3 g/dL — ABNORMAL LOW (ref 3.4–5.0)
ALKALINE PHOSPHATASE: 81 U/L (ref 46–116)
ALT (SGPT): 20 U/L (ref <=78)
ANION GAP: 8 mmol/L — ABNORMAL LOW (ref 10–20)
AST (SGOT): 13 U/L — ABNORMAL LOW (ref 15–37)
BILIRUBIN TOTAL: 0.2 mg/dL (ref 0.2–1.0)
BUN/CREA RATIO: 15
BUN: 14 mg/dL (ref 7–18)
CALCIUM, CORRECTED: 9.4 mg/dL
CALCIUM: 8.7 mg/dL (ref 8.5–10.1)
CHLORIDE: 108 mmol/L — ABNORMAL HIGH (ref 98–107)
CO2 TOTAL: 27 mmol/L (ref 21–32)
CREATININE: 0.92 mg/dL (ref 0.55–1.02)
ESTIMATED GFR: 90 mL/min/{1.73_m2} (ref >59)
GLOBULIN: 3.9
GLUCOSE: 106 mg/dL (ref 74–106)
OSMOLALITY, CALCULATED: 286 mOsm/kg (ref 270–290)
POTASSIUM: 3.9 mmol/L (ref 3.5–5.1)
PROTEIN TOTAL: 7.2 g/dL (ref 6.4–8.2)
SODIUM: 143 mmol/L (ref 136–145)

## 2022-03-15 LAB — CBC WITH DIFF
BASOPHIL #: 0.03 10*3/uL (ref 0.00–0.30)
BASOPHIL %: 0 % (ref 0–3)
EOSINOPHIL #: 0.08 10*3/uL (ref 0.00–0.80)
EOSINOPHIL %: 1 % (ref 0–7)
HCT: 38.3 % (ref 37.0–47.0)
HGB: 12.5 g/dL (ref 12.5–16.0)
LYMPHOCYTE #: 1.9 10*3/uL (ref 1.10–5.00)
LYMPHOCYTE %: 21 % — ABNORMAL LOW (ref 25–45)
MCH: 28.2 pg (ref 27.0–32.0)
MCHC: 32.7 g/dL (ref 32.0–36.0)
MCV: 86.1 fL (ref 78.0–99.0)
MONOCYTE #: 0.51 10*3/uL (ref 0.00–1.30)
MONOCYTE %: 6 % (ref 0–12)
MPV: 8 fL (ref 7.4–10.4)
NEUTROPHIL #: 6.53 10*3/uL (ref 1.80–8.40)
NEUTROPHIL %: 72 % (ref 40–76)
PLATELETS: 292 10*3/uL (ref 140–440)
RBC: 4.45 10*6/uL (ref 4.20–5.40)
RDW: 15.4 % — ABNORMAL HIGH (ref 11.6–14.8)
WBC: 9.1 10*3/uL (ref 4.0–10.5)

## 2022-03-15 LAB — LIPASE: LIPASE: 77 U/L (ref 73–393)

## 2022-03-15 LAB — POC BLOOD GLUCOSE (RESULTS): GLUCOSE, POC: 107 mg/dl (ref 50–500)

## 2022-03-15 MED ORDER — ACETAMINOPHEN 325 MG TABLET
650.0000 mg | ORAL_TABLET | ORAL | Status: AC
Start: 2022-03-15 — End: 2022-03-15
  Administered 2022-03-15: 650 mg via ORAL

## 2022-03-15 MED ORDER — ACETAMINOPHEN 325 MG TABLET
ORAL_TABLET | ORAL | Status: AC
Start: 2022-03-15 — End: 2022-03-15
  Filled 2022-03-15: qty 2

## 2022-03-15 MED ORDER — SODIUM CHLORIDE 0.9 % (FLUSH) INJECTION SYRINGE
3.0000 mL | INJECTION | Freq: Three times a day (TID) | INTRAMUSCULAR | Status: DC
Start: 2022-03-15 — End: 2022-03-15

## 2022-03-15 MED ORDER — SODIUM CHLORIDE 0.9 % (FLUSH) INJECTION SYRINGE
3.0000 mL | INJECTION | INTRAMUSCULAR | Status: DC | PRN
Start: 2022-03-15 — End: 2022-03-15

## 2022-03-15 NOTE — ED Triage Notes (Signed)
Complains of dizziness, headache and feels like heart is beating fast.  Feels shakey.   Onset of symptoms at noon.   States she ate after onset of symptoms without improvement.  States her dad had a massive MI June 10.  Patient denies chest pain

## 2022-03-15 NOTE — Discharge Instructions (Addendum)
With her positional vertigo I recommend you not salt restrict, and avoid using antihistamines such as Benadryl, hydroxyzine or Vistaril, or Antivert.  Increase your fluids, change positions more slowly, monitor your blood pressure.  Call follow-up with your primary care provider late this week or early next week.  You may need a thorough audiological evaluation if the dizziness continues.  Continue home medications, monitor your blood pressure.  CT scan was not repeated today for your headache due to the CT previously performed recently which was normal.

## 2022-03-15 NOTE — ED Nurses Note (Signed)
Rotating head toward left side and had increase in dizziness and nausea.

## 2022-03-15 NOTE — ED Provider Notes (Signed)
[facility], [facility] Emergency Department  ED Primary Provider Note  History of Present Illness   Chief Complaint   Patient presents with    Dizziness     Sabrina Morgan is a 26 y.o. female who had concerns including Dizziness.  Arrival: The patient arrived by Car    This 26 year old female patient presents emergency department with headache and dizziness that is positional in nature, onset while at work here today around noon.  She has no congestion, cough, drainage, denies chest pain, pressure, heaviness, or dyspnea.  She did have sweats and mild nausea with the headache and the dizziness earlier.  She is had no fevers or chills at home.  She is not sleeping well, a lot of stress at home due to illness in the family with her father and grandfather, and she has had menorrhagia with prolonged periods, this 1 lasting for over 3 months currently.  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  She takes metoprolol for attention, and she does check her glucose since she has had glucose intolerance.    Patient is [name] vaccinated.    Review of Systems   Pertinent positive and negative ROS as per HPI.  Historical Data   History Reviewed This Encounter:  Patient's past medical, surgical, social history reviewed noted.    Physical Exam   ED Triage Vitals [03/15/22 1518]   BP (Non-Invasive) (!) 155/90   Heart Rate 77   Respiratory Rate 18   Temperature 36.9 C (98.4 F)   SpO2 98 %   Weight 135 kg (298 lb)   Height 1.829 m (6')     Physical Exam  General:  No acute distress nontoxic but she does appears to not feel well.    Eyes: Sclera is white, conjunctivae is pink.  PERRLA, EOMI.  No horizontal or vertical nystagmus is noted.    Ears: TMs clear throat for traction  Nasal:  Nasal passages patent bilaterally   Oral:  Pink and moist oropharynx:  Pink moist   Neck:  Supple   Lungs:  Clear symmetrical good aeration   Heart: Regular rate rhythm normal S1-S2 with a very soft 1 to 2/6 apical mid systolic murmur.    Abdomen: Obese, soft, nontender.  Normal bowel sounds   Extremities:  Moving symmetrically   Skin:  No suspicious rashes or lesions, not pale no edema.    Neurological:  Cranial nerves 2-12 symmetrical except taste smell not assessed.  No gross motor or sensory deficits noted.      Patient Data     Labs Ordered/Reviewed   COMPREHENSIVE METABOLIC PANEL, NON-FASTING - Abnormal; Notable for the following components:       Result Value    CHLORIDE 108 (*)     ANION GAP 8 (*)     ALBUMIN 3.3 (*)     AST (SGOT) 13 (*)     All other components within normal limits    Narrative:     Estimated Glomerular Filtration Rate (eGFR) is calculated using the CKD-EPI (2021) equation, intended for patients 18 years of age and older. If gender is not documented or "unknown", there will be no eGFR calculation.   CBC WITH DIFF - Abnormal; Notable for the following components:    RDW 15.4 (*)     LYMPHOCYTE % 21 (*)  All other components within normal limits   LIPASE - Normal   POC BLOOD GLUCOSE (RESULTS) - Normal   CBC/DIFF    Narrative:     The following orders were created for panel order CBC/DIFF.  Procedure                               Abnormality         Status                     ---------                               -----------         ------                     CBC WITH AYTK[[id number]]                Abnormal            Final result                 Please view results for these tests on the individual orders.   PERFORM POC WHOLE BLOOD GLUCOSE     No orders to display     Medical Decision Making        Medical Decision Making  Moderate complexity due presentation, comorbidities    Amount and/or Complexity of Data Reviewed  Labs: ordered.     Details: Labs reviewed noted  ECG/medicine tests: ordered and independent interpretation performed.     Details: EKG reviewed noted    Risk  OTC  drugs.  Prescription drug management.      ED Course as of 03/15/22 1739   Wed Mar 15, 2022   1713 Patient seen re-evaluated, headache was rated as 610 initially, has improved currently.  Laboratory data reviewed discussed with the patient.  She is continue home medications, since she is had 3 months or more menses she should call and follow-up with her gyn for her irregular menses.  She does have positional vertigo that is very short-lived and no nystagmus is noted, her orthostatic pulse and blood pressure tests were negative, but she did have some brief dizziness with standing.  She will continue home medications, follow up with her primary care provider, see discharge.         Medications Administered in the ED   NS flush syringe (has no administration in time range)   acetaminophen (TYLENOL) tablet (650 mg Oral Given 03/15/22 1738)     Clinical Impression   Benign paroxysmal positional vertigo, unspecified laterality (Primary)   Essential hypertension   Headache   Menorrhagia       Disposition: Discharged    .Marland KitchenBretta Bang, DO

## 2022-03-16 DIAGNOSIS — I6783 Posterior reversible encephalopathy syndrome: Secondary | ICD-10-CM | POA: Insufficient documentation

## 2022-03-16 LAB — ECG 12 LEAD
Atrial Rate: 64 {beats}/min
Calculated P Axis: 52 degrees
Calculated R Axis: 78 degrees
Calculated T Axis: 71 degrees
PR Interval: 194 ms
QRS Duration: 72 ms
QT Interval: 398 ms
QTC Calculation: 410 ms
Ventricular rate: 64 {beats}/min

## 2022-03-17 ENCOUNTER — Emergency Department (HOSPITAL_BASED_OUTPATIENT_CLINIC_OR_DEPARTMENT_OTHER): Payer: BC Managed Care – PPO

## 2022-03-17 ENCOUNTER — Emergency Department
Admission: EM | Admit: 2022-03-17 | Discharge: 2022-03-18 | Disposition: A | Discharge status: Other Acute Inpatient Hospital | Payer: BC Managed Care – PPO | Attending: Family | Admitting: Family

## 2022-03-17 ENCOUNTER — Other Ambulatory Visit: Payer: Self-pay

## 2022-03-17 DIAGNOSIS — R569 Unspecified convulsions: Secondary | ICD-10-CM | POA: Insufficient documentation

## 2022-03-17 DIAGNOSIS — R519 Headache, unspecified: Secondary | ICD-10-CM | POA: Insufficient documentation

## 2022-03-17 DIAGNOSIS — I10 Essential (primary) hypertension: Secondary | ICD-10-CM | POA: Insufficient documentation

## 2022-03-17 DIAGNOSIS — R55 Syncope and collapse: Secondary | ICD-10-CM

## 2022-03-17 LAB — COMPREHENSIVE METABOLIC PANEL, NON-FASTING
ALBUMIN/GLOBULIN RATIO: 0.9 (ref 0.8–1.4)
ALBUMIN: 3.5 g/dL (ref 3.4–5.0)
ALKALINE PHOSPHATASE: 94 U/L (ref 46–116)
ALT (SGPT): 20 U/L (ref <=78)
ANION GAP: 11 mmol/L (ref 10–20)
AST (SGOT): 14 U/L — ABNORMAL LOW (ref 15–37)
BILIRUBIN TOTAL: 0.2 mg/dL (ref 0.2–1.0)
BUN/CREA RATIO: 16
BUN: 13 mg/dL (ref 7–18)
CALCIUM, CORRECTED: 9.7 mg/dL
CALCIUM: 9.2 mg/dL (ref 8.5–10.1)
CHLORIDE: 106 mmol/L (ref 98–107)
CO2 TOTAL: 25 mmol/L (ref 21–32)
CREATININE: 0.79 mg/dL (ref 0.55–1.02)
ESTIMATED GFR: 108 mL/min/{1.73_m2} (ref >59)
GLOBULIN: 4.1
GLUCOSE: 96 mg/dL (ref 74–106)
OSMOLALITY, CALCULATED: 283 mOsm/kg (ref 270–290)
POTASSIUM: 3.6 mmol/L (ref 3.5–5.1)
PROTEIN TOTAL: 7.6 g/dL (ref 6.4–8.2)
SODIUM: 142 mmol/L (ref 136–145)

## 2022-03-17 LAB — CBC WITH DIFF
BASOPHIL #: 0.03 10*3/uL (ref 0.00–0.30)
BASOPHIL %: 0 % (ref 0–3)
EOSINOPHIL #: 0.12 10*3/uL (ref 0.00–0.80)
EOSINOPHIL %: 1 % (ref 0–7)
HCT: 38.7 % (ref 37.0–47.0)
HGB: 12.9 g/dL (ref 12.5–16.0)
LYMPHOCYTE #: 1.96 10*3/uL (ref 1.10–5.00)
LYMPHOCYTE %: 19 % — ABNORMAL LOW (ref 25–45)
MCH: 28.6 pg (ref 27.0–32.0)
MCHC: 33.3 g/dL (ref 32.0–36.0)
MCV: 86.1 fL (ref 78.0–99.0)
MONOCYTE #: 0.53 10*3/uL (ref 0.00–1.30)
MONOCYTE %: 5 % (ref 0–12)
MPV: 7.9 fL (ref 7.4–10.4)
NEUTROPHIL #: 7.92 10*3/uL (ref 1.80–8.40)
NEUTROPHIL %: 75 % (ref 40–76)
PLATELETS: 280 10*3/uL (ref 140–440)
RBC: 4.5 10*6/uL (ref 4.20–5.40)
RDW: 15.4 % — ABNORMAL HIGH (ref 11.6–14.8)
WBC: 10.6 10*3/uL — ABNORMAL HIGH (ref 4.0–10.5)

## 2022-03-17 LAB — ECG 12 LEAD
Calculated R Axis: 38 degrees
QT Interval: 396 ms
QTC Calculation: 430 ms

## 2022-03-17 LAB — CREATINE KINASE (CK), TOTAL, SERUM: CREATINE KINASE: 92 U/L (ref 26–192)

## 2022-03-17 LAB — LACTIC ACID LEVEL W/ REFLEX FOR LEVEL >2.0: LACTIC ACID: 1.3 mmol/L (ref 0.4–2.0)

## 2022-03-17 MED ORDER — LORAZEPAM 2 MG/ML INJECTION SYRINGE
INJECTION | INTRAMUSCULAR | Status: AC
Start: 2022-03-17 — End: 2022-03-17
  Filled 2022-03-17: qty 1

## 2022-03-17 MED ORDER — SODIUM CHLORIDE 0.9 % IV BOLUS
1000.0000 mL | INJECTION | Status: AC
Start: 2022-03-17 — End: 2022-03-17
  Administered 2022-03-17: 0 mL via INTRAVENOUS
  Administered 2022-03-17: 1000 mL via INTRAVENOUS

## 2022-03-17 MED ORDER — KETOROLAC 30 MG/ML (1 ML) INJECTION SOLUTION
INTRAMUSCULAR | Status: AC
Start: 2022-03-17 — End: 2022-03-17
  Filled 2022-03-17: qty 1

## 2022-03-17 MED ORDER — SODIUM CHLORIDE 0.9 % INTRAVENOUS SOLUTION
1500.0000 mg | Freq: Two times a day (BID) | INTRAVENOUS | Status: DC
Start: 2022-03-17 — End: 2022-03-18
  Administered 2022-03-17: 0 mg via INTRAVENOUS
  Administered 2022-03-17 – 2022-03-18 (×2): 1500 mg via INTRAVENOUS
  Administered 2022-03-18: 0 mg via INTRAVENOUS
  Filled 2022-03-17: qty 15

## 2022-03-17 MED ORDER — LEVETIRACETAM 500 MG/5 ML INTRAVENOUS SOLUTION
INTRAVENOUS | Status: AC
Start: 2022-03-17 — End: 2022-03-17
  Filled 2022-03-17: qty 15

## 2022-03-17 MED ORDER — SODIUM CHLORIDE 0.9 % (FLUSH) INJECTION SYRINGE
3.0000 mL | INJECTION | Freq: Three times a day (TID) | INTRAMUSCULAR | Status: DC
Start: 2022-03-17 — End: 2022-03-18
  Administered 2022-03-17 – 2022-03-18 (×2): 0 mL

## 2022-03-17 MED ORDER — SODIUM CHLORIDE 0.9 % (FLUSH) INJECTION SYRINGE
3.0000 mL | INJECTION | INTRAMUSCULAR | Status: DC | PRN
Start: 2022-03-17 — End: 2022-03-18

## 2022-03-17 MED ORDER — IOHEXOL 350 MG IODINE/ML INTRAVENOUS SOLUTION
100.0000 mL | INTRAVENOUS | Status: AC
Start: 2022-03-17 — End: 2022-03-17
  Administered 2022-03-17: 100 mL via INTRAVENOUS

## 2022-03-17 MED ORDER — ENALAPRILAT 1.25 MG/ML INTRAVENOUS SOLUTION
1.2500 mg | INTRAVENOUS | Status: AC
Start: 2022-03-17 — End: 2022-03-17
  Administered 2022-03-17: 1.25 mg via INTRAVENOUS

## 2022-03-17 MED ORDER — ENALAPRILAT 1.25 MG/ML INTRAVENOUS SOLUTION
INTRAVENOUS | Status: AC
Start: 2022-03-17 — End: 2022-03-17
  Filled 2022-03-17: qty 2

## 2022-03-17 MED ORDER — ENALAPRILAT 1.25 MG/ML INTRAVENOUS SOLUTION
0.6250 mg | INTRAVENOUS | Status: AC
Start: 2022-03-17 — End: 2022-03-17
  Administered 2022-03-17: 0.625 mg via INTRAVENOUS

## 2022-03-17 MED ORDER — KETOROLAC 30 MG/ML (1 ML) INJECTION SOLUTION
30.0000 mg | INTRAMUSCULAR | Status: AC
Start: 2022-03-17 — End: 2022-03-17
  Administered 2022-03-17: 30 mg via INTRAVENOUS

## 2022-03-17 MED ORDER — LORAZEPAM 2 MG/ML INJECTION WRAPPER
0.5000 mg | INTRAMUSCULAR | Status: AC
Start: 2022-03-17 — End: 2022-03-17
  Administered 2022-03-17: 0.5 mg via INTRAVENOUS

## 2022-03-17 NOTE — ED Provider Notes (Signed)
[facility], [facility] Emergency Department  ED Primary Provider Note  History of Present Illness   Chief Complaint   Patient presents with    Altered Mental Status    Seizure- Actively Seizing On Arrival     Arrival: The patient arrived by Car    Sabrina Morgan is a 26 y.o. female who had concerns including Altered Mental Status and Seizure- Actively Seizing On Arrival. Pt arrives decreased responsiveness. Mother states had seizure on the way here and stopped breathing. Has had multiple syncope episodes and BP has been high.     Review of Systems   Constitutional: No fever, chills + weakness   Skin: No rash or diaphoresis  HENT: +headaches, no congestion  Eyes: No vision changes or photophobia   Cardio: No chest pain, palpitations or leg swelling   Respiratory: No cough, wheezing + SOB  GI:  No nausea, vomiting or stool changes  GU:  No dysuria, hematuria, or increased frequency  MSK: No muscle aches, joint or back pain  Neuro: + seizures, LOC, numbness, tingling,   Psychiatric: No depression, SI or substance abuse  All other systems reviewed and are negative.    Historical Data   History Reviewed This Encounter:  all noted and reviewed.     Physical Exam   ED Triage Vitals [03/17/22 1944]   BP (Non-Invasive) (!) 150/87   Heart Rate 71   Respiratory Rate 16   Temperature 36.2 C (97.1 F)   SpO2 98 %   Weight    Height        Constitutional:  26 y.o. female who appears in no distress. Pale, lethargic. , no cyanosis.   HENT:   Head: Normocephalic and atraumatic.   Mouth/Throat: Oropharynx is clear and moist.   Eyes: EOMI, PERRL   Neck: Trachea midline. Neck supple.  Cardiovascular: RRR, No murmurs, rubs or gallops. Intact distal pulses.  Pulmonary/Chest: BS equal bilaterally. No respiratory distress. No wheezes, rales or chest tenderness.   Abdominal: Bowel sounds present and normal. Abdomen soft, no tenderness, no rebound and no guarding.  Back: No midline spinal tenderness, no  paraspinal tenderness, no CVA tenderness.           Musculoskeletal: No edema, tenderness or deformity.  Skin: warm and dry. No rash, erythema, pallor or cyanosis  Psychiatric: normal mood and affect. Behavior is normal.   Neurological: responsive to shake shout stimuli on arrival. Speech clear.   Patient Data     Labs Ordered/Reviewed   COMPREHENSIVE METABOLIC PANEL, NON-FASTING - Abnormal; Notable for the following components:       Result Value    AST (SGOT) 14 (*)     All other components within normal limits    Narrative:     Estimated Glomerular Filtration Rate (eGFR) is calculated using the CKD-EPI (2021) equation, intended for patients 1 years of age and older. If gender is not documented or "unknown", there will be no eGFR calculation.   CBC WITH DIFF - Abnormal; Notable for the following components:    WBC 10.6 (*)     RDW 15.4 (*)     LYMPHOCYTE % 19 (*)     All other components within normal limits   CREATINE KINASE (CK), TOTAL, SERUM - Normal   LACTIC ACID LEVEL W/ REFLEX FOR LEVEL >2.0 - Normal   CBC/DIFF    Narrative:     The following orders were created for panel order CBC/DIFF.  Procedure  Abnormality         Status                     ---------                               -----------         ------                     CBC WITH QDIY[[id number]]                Abnormal            Final result                 Please view results for these tests on the individual orders.   ARTERIAL BLOOD GAS/LACTATE     CT ANGIO INTRA-EXTRA CRANIAL W IV CONTRAST   Final Result by Edi, Radresults In (07/28 2057)   1.RIGHT CAROTID: NORMAL   2.LEFT CAROTID: NORMAL   3.VERTEBRALS: NORMAL   4.INTRACRANIAL: NO ANEURYSM OR HIGH-GRADE STENOSIS /OCCLUSION OF THE VESSELS   5.         One or more dose reduction techniques were used (e.g., Automated exposure control, adjustment of the mA and/or kV according to patient size, use of iterative reconstruction technique).         Radiologist location ID:  [facility] Decision Making   Diff dx of seizure. Syncope dehydration. Hypertension uncontrolled .     No neuro/ nor beds [facility]. [facility] no beds. Dr. Weldon Inches at [facility] accepted.     Medications Administered in the ED   NS flush syringe (0 mL Intracatheter Not Given 03/17/22 2200)   NS flush syringe (has no administration in time range)   levETIRAcetam (KEPPRA) 1,500 mg in NS 100 mL IVPB (0 mg Intravenous Stopped 03/17/22 2028)   enalaprilat (VASOTEC) 1.25 mg/mL injection (has no administration in time range)   ketorolac (TORADOL) 30 mg/mL injection (has no administration in time range)   LORazepam (ATIVAN) 2 mg/mL injection (0.5 mg Intravenous Given 03/17/22 2013)   NS bolus infusion 1,000 mL (0 mL Intravenous Stopped 03/17/22 2113)   iohexol (OMNIPAQUE 350) infusion (100 mL Intravenous Given 03/17/22 2040)   enalaprilat (VASOTEC) 1.25 mg/mL injection (0.625 mg Intravenous Given 03/17/22 2159)     Clinical Impression   Syncope and collapse (Primary)   Seizure-like activity (CMS HCC)   Hypertension, unspecified type       Disposition: Transfered to Another Facility

## 2022-03-17 NOTE — ED Nurses Note (Signed)
[facility] transfer center called for possible transfer. They took patient information and will call us back.

## 2022-03-17 NOTE — ED Triage Notes (Signed)
Family reports recently discharged from [facility] with seizures. Family reports prolonged seizure activity tonight . Pt AMS on arrival to ED

## 2022-03-17 NOTE — ED Nurses Note (Signed)
Dr. Patric Dykes from [facility] called, patient was accepted to ER. Report [id number]

## 2022-03-17 NOTE — ED Triage Notes (Signed)
Transport attempts     [facility]- only 1 ALS crew  [facility]- no crews  [facility]- no crews  [facility]- no crews  [facility]- will try to find someone/ will let us know  [facility]-no crew    [facility]- declined for weather  [facility]- declined for weather

## 2022-03-18 LAB — ECG 12 LEAD
Atrial Rate: 71 {beats}/min
Calculated P Axis: 34 degrees
Calculated T Axis: 43 degrees
PR Interval: 194 ms
QRS Duration: 76 ms
Ventricular rate: 71 {beats}/min

## 2022-03-18 MED ORDER — LORAZEPAM 2 MG/ML INJECTION SYRINGE
INJECTION | INTRAMUSCULAR | Status: AC
Start: 2022-03-18 — End: 2022-03-18
  Filled 2022-03-18: qty 1

## 2022-03-18 MED ORDER — KETOROLAC 30 MG/ML (1 ML) INJECTION SOLUTION
INTRAMUSCULAR | Status: AC
Start: 2022-03-18 — End: 2022-03-18
  Filled 2022-03-18: qty 1

## 2022-03-18 MED ORDER — SODIUM CHLORIDE 0.9 % INTRAVENOUS PIGGYBACK
INJECTION | INTRAVENOUS | Status: AC
Start: 2022-03-18 — End: 2022-03-18
  Filled 2022-03-18: qty 100

## 2022-03-18 MED ORDER — LORAZEPAM 2 MG/ML INJECTION WRAPPER
1.0000 mg | INTRAMUSCULAR | Status: AC
Start: 2022-03-18 — End: 2022-03-18
  Administered 2022-03-18: 1 mg via INTRAVENOUS

## 2022-03-18 MED ORDER — PROMETHAZINE 25 MG TABLET
ORAL_TABLET | ORAL | Status: AC
Start: 2022-03-18 — End: 2022-03-18
  Filled 2022-03-18: qty 1

## 2022-03-18 MED ORDER — LEVETIRACETAM 500 MG/5 ML INTRAVENOUS SOLUTION
INTRAVENOUS | Status: AC
Start: 2022-03-18 — End: 2022-03-18
  Filled 2022-03-18: qty 15

## 2022-03-18 MED ORDER — PROMETHAZINE 25 MG TABLET
25.0000 mg | ORAL_TABLET | ORAL | Status: AC
Start: 2022-03-18 — End: 2022-03-18
  Administered 2022-03-18: 25 mg via ORAL

## 2022-03-18 MED ORDER — KETOROLAC 30 MG/ML (1 ML) INJECTION SOLUTION
15.0000 mg | INTRAMUSCULAR | Status: AC
Start: 2022-03-18 — End: 2022-03-18
  Administered 2022-03-18: 15 mg via INTRAVENOUS

## 2022-03-18 NOTE — ED Nurses Note (Signed)
Called to CT by CT tech informing that patient states she is going to pass out.  Charge RN to CT to check on patient.  VSS.  No significant change in neuro status during this time.

## 2022-03-18 NOTE — ED Nurses Note (Signed)
Patient's mother leaving to go home at this time while patient awaiting EMS transport.  Patient calm and appears relaxed.  Patient encouraged to rest.  Patient laying in bed. VSS.  Call bell in reach,

## 2022-03-18 NOTE — ED Nurses Note (Signed)
Patient assisted to bedside commode with x2 assist at this time. Patient tolerated well and had medium soft BM. Patient back to bed and denies any further needs. VSS. Will continue to monitor.

## 2022-03-18 NOTE — ED Nurses Note (Signed)
Patient continues to rest quietly in bed with eyes closed.  Resps even and unlabored.  No s/s seizure like activity.  VSS.  Mother called to check on patient and updated on patient condition.

## 2022-03-18 NOTE — ED Nurses Note (Signed)
Patient reports "I feel like one of those episodes is coming on." Patient appears tearful, and states she rested well. Patient verbalizes feeling worn out, tired, and "swimmy headed". Discussed with patient transfer plans. Verbalizes understanding. Patient scooted her bottom down in bed to rest head back, states "I'm trying to fight this episode about to happen". Eyes closed, patient's head tilted to the left, patient not answering verbal stimuli. Respirations remained even and unlabored, saturation 98%, HR 60's and patient's head only began shaking for approximately 10 seconds. Patient opened eyes, asked her if she was okay and she states "yeah".

## 2022-03-18 NOTE — ED Nurses Note (Signed)
Patient continues to rest quietly in bed with eyes closed.  Resps even and unlabored.  VSS.

## 2022-03-18 NOTE — ED Nurses Note (Signed)
Patient brought from car to ER 8 with family stating patient has had multiple seizures since being discharged from [facility] earlier today.  Family states patient was not breathing during seizure like activity,  Patient placed on cardiac, BP, O2 monitor. NSR rate of 71.  VSS O2 98% on room air.  Patient answers questions appropriately and joking with staff at times.  NP at bedside. While working with patient patient states "it is going to happen again" and head went back onto bed.  Appeared to be unresponsive but responded to verbal stimuli and encouragement from family and friends.

## 2022-03-18 NOTE — ED Nurses Note (Signed)
[facility] here at this time to transport patient to [facility] ER. Patient's mother informed of patient's current status per patient. Also EMS given patient's parent's contact information as per patient's wishes. At time of departure, patient is alert and oriented.

## 2022-03-18 NOTE — ED Nurses Note (Signed)
Patient rang out call bell. States she had a headache generalized around the head with a stabbing pain. Rated pain 7/10. Provider notified and meds given per order.

## 2022-03-18 NOTE — ED Nurses Note (Signed)
Patient resting quietly in bed with eyes closed.  Resps even and unlabored.  Appears comfortable.  Lights dimmed and provided additional warm blanket.

## 2022-03-18 NOTE — ED Nurses Note (Signed)
Patient's family and friend has been to the nurses station several times informing that patient feels "like she's going to pass out" and "she feels like another seizure is coming on".  When in room with patient VS remain stable and unchanged.   Airway patent.  Patient answers questions appropriately.  Does not appear postictal or altered.  Patient to CT.

## 2022-03-18 NOTE — ED Nurses Note (Signed)
Report called to [facility] ED at this time.

## 2022-03-18 NOTE — ED Nurses Note (Signed)
Patient called out using call bell and stated "I think I'm going to pass out".  States she woke up feeling like she was going to pass out.  VSS.  Patient repositioned in bed.  States she needs to void.  Offered bedpan.  While putting patient on bedpan she held up one finger motioning to "hold on" and began having sudden onset of rapid jerking movements in bilateral arms and legs.  Lasted approx 10 seconds.  Sinus rhythm on monitor.  Increase in HR to 70's during episode and returned to mid 60's following episode.  O2 remained 98-99 on room air.  Patient quiet with eyes open for approx 1 min s/p episode and then placed on bedpan.  Able to assist in getting on bedpan,  Unable to void at this time but states she would like to sit up for a while.  Patient positioned for comfort.  Monitoring continues

## 2022-03-18 NOTE — ED Nurses Note (Signed)
[facility] contacted to transport patient at this time

## 2022-03-18 NOTE — ED Nurses Note (Signed)
[facility] only has one ALS crew tonight    [facility] only has one crew but can possible do it in the morning.

## 2022-03-18 NOTE — ED Nurses Note (Signed)
Patient has had multiple episodes reporting she feels like she is going to pass out or have a seizure.  VS remain stable and unchanged during episodes.  Patient able to answer questions and interact appropriately s/p episodes.  Patient and family provided reassurance.  VSS. Monitoring continues.

## 2022-04-04 ENCOUNTER — Encounter (HOSPITAL_BASED_OUTPATIENT_CLINIC_OR_DEPARTMENT_OTHER): Payer: Self-pay

## 2022-04-04 ENCOUNTER — Emergency Department
Admission: EM | Admit: 2022-04-04 | Discharge: 2022-04-04 | Disposition: A | Discharge status: Home / Self Care | Payer: BC Managed Care – PPO | Attending: Family | Admitting: Family

## 2022-04-04 ENCOUNTER — Other Ambulatory Visit: Payer: Self-pay

## 2022-04-04 ENCOUNTER — Emergency Department (HOSPITAL_BASED_OUTPATIENT_CLINIC_OR_DEPARTMENT_OTHER): Payer: BC Managed Care – PPO

## 2022-04-04 DIAGNOSIS — R569 Unspecified convulsions: Secondary | ICD-10-CM | POA: Insufficient documentation

## 2022-04-04 DIAGNOSIS — R0600 Dyspnea, unspecified: Secondary | ICD-10-CM | POA: Insufficient documentation

## 2022-04-04 DIAGNOSIS — F449 Dissociative and conversion disorder, unspecified: Secondary | ICD-10-CM

## 2022-04-04 LAB — BASIC METABOLIC PANEL
ANION GAP: 11 mmol/L (ref 10–20)
BUN/CREA RATIO: 14
BUN: 12 mg/dL (ref 7–18)
CALCIUM: 8.8 mg/dL (ref 8.5–10.1)
CHLORIDE: 106 mmol/L (ref 98–107)
CO2 TOTAL: 26 mmol/L (ref 21–32)
CREATININE: 0.83 mg/dL (ref 0.55–1.02)
ESTIMATED GFR: 102 mL/min/{1.73_m2} (ref >59)
GLUCOSE: 84 mg/dL (ref 74–106)
OSMOLALITY, CALCULATED: 284 mOsm/kg (ref 270–290)
POTASSIUM: 3.5 mmol/L (ref 3.5–5.1)
SODIUM: 143 mmol/L (ref 136–145)

## 2022-04-04 LAB — CBC WITH DIFF
BASOPHIL #: 0.03 10*3/uL (ref 0.00–0.30)
BASOPHIL %: 0 % (ref 0–3)
EOSINOPHIL #: 0.06 10*3/uL (ref 0.00–0.80)
EOSINOPHIL %: 1 % (ref 0–7)
HCT: 35.7 % — ABNORMAL LOW (ref 37.0–47.0)
HGB: 12.2 g/dL — ABNORMAL LOW (ref 12.5–16.0)
LYMPHOCYTE #: 2.65 10*3/uL (ref 1.10–5.00)
LYMPHOCYTE %: 29 % (ref 25–45)
MCH: 29.4 pg (ref 27.0–32.0)
MCHC: 34.1 g/dL (ref 32.0–36.0)
MCV: 86.2 fL (ref 78.0–99.0)
MONOCYTE #: 0.52 10*3/uL (ref 0.00–1.30)
MONOCYTE %: 6 % (ref 0–12)
MPV: 8.1 fL (ref 7.4–10.4)
NEUTROPHIL #: 6.01 10*3/uL (ref 1.80–8.40)
NEUTROPHIL %: 65 % (ref 40–76)
PLATELETS: 308 10*3/uL (ref 140–440)
RBC: 4.14 10*6/uL — ABNORMAL LOW (ref 4.20–5.40)
RDW: 15.5 % — ABNORMAL HIGH (ref 11.6–14.8)
WBC: 9.3 10*3/uL (ref 4.0–10.5)

## 2022-04-04 LAB — ARTERIAL BLOOD GAS/LACTATE
%FIO2 (ARTERIAL): 21 %
BASE EXCESS (ARTERIAL): 0.4 mmol/L (ref <=2.0)
BICARBONATE (ARTERIAL): 24.8 mmol/L (ref 20.0–26.0)
CARBOXYHEMOGLOBIN: 0.8 % (ref <=1.5)
LACTATE: 0.3 mmol/L (ref <=4.0)
MET-HEMOGLOBIN: 0.3 % (ref <=2.0)
O2CT: 16.3 %
OXYHEMOGLOBIN: 95.9 % (ref 88.0–100.0)
PCO2 (ARTERIAL): 39 mm/Hg (ref 35–45)
PH (ARTERIAL): 7.42 (ref 7.35–7.45)
PO2 (ARTERIAL): 82 mm/Hg (ref 80–100)

## 2022-04-04 LAB — LACTIC ACID LEVEL W/ REFLEX FOR LEVEL >2.0: LACTIC ACID: 0.9 mmol/L (ref 0.4–2.0)

## 2022-04-04 LAB — CREATINE KINASE (CK), TOTAL, SERUM: CREATINE KINASE: 84 U/L (ref 26–192)

## 2022-04-04 MED ORDER — FOSPHENYTOIN 500 MG PE/10 ML INJECTION SOLUTION
INTRAMUSCULAR | Status: AC
Start: 2022-04-04 — End: 2022-04-04
  Filled 2022-04-04: qty 30

## 2022-04-04 MED ORDER — SODIUM CHLORIDE 0.9 % INTRAVENOUS SOLUTION
15.0000 mg/kg | Freq: Once | INTRAVENOUS | Status: AC
Start: 2022-04-04 — End: 2022-04-04
  Administered 2022-04-04: 1425 mg via INTRAVENOUS
  Filled 2022-04-04: qty 30

## 2022-04-04 NOTE — ED Nurses Note (Signed)
Patient

## 2022-04-04 NOTE — ED Nurses Note (Signed)
Patients family at bedside and patient visual from the desk. Patient turned head to the left and started shaking in her upper body, head and upper right extremity also shaking. This started at 1725 and she awakened on her own at 28.

## 2022-04-04 NOTE — ED Triage Notes (Signed)
EMS which is family states the pt was in her car and they noticed she was having seizures. No injuries from the seizures.

## 2022-04-04 NOTE — ED Provider Notes (Signed)
Sabrina Morgan, [facility] Emergency Department  ED Primary Provider Note  History of Present Illness   Chief Complaint   Patient presents with    Seizure Prior Hx Of     Arrival: The patient arrived by Ambulance    Sabrina Morgan is a 26 y.o. female who had concerns including Seizure Prior Hx Of. Pt had work up in [employer] [facility] [employer]. [place] dx poss pres syndrome that may be trigger sz like activity. On Keppra. Saw PCP today had send out level. Relates upper body sz activity today no post ictal phase. Ems stated pt followed commands during activity in ambulance.     Review of Systems   Constitutional: No fever, chills or weakness   Skin: No rash or diaphoresis  HENT: No headaches, or congestion  Eyes: No vision changes or photophobia   Cardio: No chest pain, palpitations or leg swelling   Respiratory: No cough, wheezing or SOB  GI:  No nausea, vomiting or stool changes  GU:  No dysuria, hematuria, or increased frequency  MSK: No muscle aches, joint or back pain  Neuro: +seizures,  no LOC, numbness, tingling, or focal weakness  Psychiatric: No depression, SI or substance abuse  All other systems reviewed and are negative.    Historical Data   History Reviewed This Encounter: all noted and reviewed.     Physical Exam   ED Triage Vitals [04/04/22 1643]   BP (Non-Invasive) (!) 159/102   Heart Rate 81   Respiratory Rate 17   Temperature 36.9 C (98.4 F)   SpO2 99 %   Weight 132 kg (290 lb)   Height 1.803 m (5' 11")       Constitutional:  26 y.o. female who appears in no distress. Normal color, no cyanosis.   HENT:   Head: Normocephalic and atraumatic.   Mouth/Throat: Oropharynx is clear and moist.   Eyes: EOMI, PERRL   Neck: Trachea midline. Neck supple.  Cardiovascular: RRR, No murmurs, rubs or gallops. Intact distal pulses.  Pulmonary/Chest: BS equal bilaterally. No respiratory distress. No wheezes, rales or chest tenderness.   Abdominal: Bowel sounds present and normal. Abdomen  soft, no tenderness, no rebound and no guarding.  Back: No midline spinal tenderness, no paraspinal tenderness, no CVA tenderness.           Musculoskeletal: No edema, tenderness or deformity.  Skin: warm and dry. No rash, erythema, pallor or cyanosis  Psychiatric: normal mood and affect. Behavior is normal.   Neurological: Patient keenly alert and responsive, easily able to raise eyebrows, facial muscles/expressions symmetric, speaking in fluent sentences, moving all extremities equally and fully, normal gait  Patient Data     Labs Ordered/Reviewed   CBC WITH DIFF - Abnormal; Notable for the following components:       Result Value    RBC 4.14 (*)     HGB 12.2 (*)     HCT 35.7 (*)     RDW 15.5 (*)     All other components within normal limits   CREATINE KINASE (CK), TOTAL, SERUM - Normal   LACTIC ACID LEVEL W/ REFLEX FOR LEVEL >2.0 - Normal   CBC/DIFF    Narrative:     The following orders were created for panel order CBC/DIFF.  Procedure                               Abnormality  Status                     ---------                               -----------         ------                     CBC WITH IDHW[[id number]]                Abnormal            Final result                 Please view results for these tests on the individual orders.   BASIC METABOLIC PANEL    Narrative:     Estimated Glomerular Filtration Rate (eGFR) is calculated using the CKD-EPI (2021) equation, intended for patients 35 years of age and older. If gender is not documented or "unknown", there will be no eGFR calculation.   ARTERIAL BLOOD GAS/LACTATE    Narrative:     ROOM AIR     XR CHEST AP   Final Result by Edi, Radresults In (08/15 1803)   NEGATIVE CHEST         Radiologist location ID: [place] Decision Making   Diff dx conversion syndrome. Dehydration.          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

## 2022-04-04 NOTE — ED Nurses Note (Signed)
Zannie Kehr NP in to speak with patient and went over all discharge instructions. Patient verbalized understanding. Family at bedside.

## 2022-04-04 NOTE — ED Nurses Note (Signed)
Patient discharged home all instructions gone over with patient including medications with opportunity to ask questions. Patient verbalized understanding and ambulated off unit independently with family.

## 2022-04-04 NOTE — ED Nurses Note (Signed)
Patient up and ambulated to bathroom independently.

## 2022-04-04 NOTE — ED Nurses Note (Signed)
Patient awake and oriented stating she needs to urinate at this time.

## 2022-04-17 ENCOUNTER — Ambulatory Visit (INDEPENDENT_AMBULATORY_CARE_PROVIDER_SITE_OTHER): Payer: BC Managed Care – PPO | Admitting: NEUROLOGY

## 2022-04-26 ENCOUNTER — Encounter (INDEPENDENT_AMBULATORY_CARE_PROVIDER_SITE_OTHER): Payer: Self-pay | Admitting: NEUROLOGY

## 2022-04-26 DIAGNOSIS — F3289 Other specified depressive episodes: Secondary | ICD-10-CM | POA: Insufficient documentation

## 2022-04-26 DIAGNOSIS — S63502A Unspecified sprain of left wrist, initial encounter: Secondary | ICD-10-CM | POA: Insufficient documentation

## 2022-04-26 DIAGNOSIS — S43491A Other sprain of right shoulder joint, initial encounter: Secondary | ICD-10-CM | POA: Insufficient documentation

## 2022-04-26 DIAGNOSIS — F419 Anxiety disorder, unspecified: Secondary | ICD-10-CM | POA: Insufficient documentation

## 2022-04-27 ENCOUNTER — Ambulatory Visit (INDEPENDENT_AMBULATORY_CARE_PROVIDER_SITE_OTHER): Payer: Self-pay | Admitting: NEUROLOGY

## 2022-05-01 ENCOUNTER — Other Ambulatory Visit: Payer: Self-pay

## 2022-05-01 ENCOUNTER — Ambulatory Visit (INDEPENDENT_AMBULATORY_CARE_PROVIDER_SITE_OTHER): Payer: BC Managed Care – PPO | Admitting: NEUROLOGY

## 2022-05-01 ENCOUNTER — Encounter (INDEPENDENT_AMBULATORY_CARE_PROVIDER_SITE_OTHER): Payer: Self-pay | Admitting: NEUROLOGY

## 2022-05-01 VITALS — BP 144/84 | HR 76 | Temp 98.3°F | Ht 72.0 in | Wt 295.2 lb

## 2022-05-01 DIAGNOSIS — R4020 Unspecified coma: Secondary | ICD-10-CM

## 2022-05-01 NOTE — Patient Instructions (Signed)
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    Functional neurologic disorders can cause bothersome symptoms including weakness, abnormal movements, tremor, and others. They are thought of as "software" problems of the nervous system, in contrast with "hardware" problems like strokes or multiple sclerosis where we can point to a particular location on a brain scan to locate the problem that corresponds with people's symptoms. Despite not being able to point to where the problem is precisely, we know that dysfunction of the programing pathways that leads to the symptoms. There is additional information at scientifically validated FND resource website Neurosymptoms.org, including:    1.You have something common - you are not alone  2.You have something genuine - you are not imagining it  3.You have symptoms that are potentially reversible  4.It's not your fault that you have these symptoms  5.Medications for other neurologic conditions are not helpful for FND  6.You will need to put in work to get better    Some helpful websites include:  www.ninds.nih.gov/health-information/disorders/functional-neurologic-disorder  www.fndhope.org  www.fndsociety.org  www.neurosymptoms.org/en    Some patients have had success in managing their functional neurological disorder with the following program  Dynamic Neural Retraining System (DNRS) - https://retrainingthebrain.com/?[email or web address]  * Note: our recommendation for this program is only based on success from other patients.  We are not connected with this program in any way nor do we receive any "kickbacks" for referrals.    Physical or occupational therapy is helpful for re-programing to minimize symptoms. Forms of biofeedback are particularly helpful for people.    We know that people with functional neurologic disorders can have  co-existing psychological conditions (depression, anxiety, ore PTSD). Working with a mental health professional using techniques like cognitive behavioral therapy (CBT) can be useful to understand how psychological problems are overlapping and interacting with a functional neurologic disorder.

## 2022-05-01 NOTE — Progress Notes (Signed)
ASSESSMENT  1. Loss of consciousness events:  She is a 26 year old woman who is referred for multiple episodes of altered consciousness.  Her neurologic exam is normal.  She is had extensive workup both at [facility] as well as [facility] in [place].  This is included continuous EEG monitoring which showed no epileptiform activity.  Unfortunately an event was not captured, however, she did report a similar aura while she was hooked up without any changes on EEG.  She is also had a routine EEG at [facility] doctors which was reportedly normal.  She underwent an MRI of the brain with and without contrast which was unremarkable.  The only significant findings from her workups have been a significant episode of bradycardia as well as first-degree AV block on EKG.  While these events are somewhat stereotyped, there are few other features that are concerning for true epileptiform activity.  Even the event was not captured, she did have continuous EEG monitoring shortly after having multiple back-to-back events, so I would have expected to see some abnormality indicating a lowered seizure threshold.  The fact that these events also did not begin until after 2 traumatic life events is more suggestive psychogenic nonepileptic seizures though stress can make both PNES and epilepsy worse.  Given her negative workup, I am inclined to wean her off of Keppra particularly since she seems to be having some adverse effects.  We will begin weaning by 500 mg weekly until she is off.  If she were to have recurrent events, I may reconsider antiseizure medication as well as a possible stay in an epilepsy monitoring unit.  I have encouraged her to continue her work with therapy, and I provided her information about functional neurologic disorders.  2. Headache:  These are relatively new in onset.  We did not discuss these in great depth today.  We will have her wean off the Keppra and re-evaluate her headache frequency and  severity to determine she may warrant prophylactic therapy.    PLAN  Wean Keppra by 500 mg every week until off.        We will consider further medication and/or workup if events worsened        Counseled on [place] state driving laws. she is not currently driving.        Return to work note provided.  2.   We will reassess after she is off Keppra    Return to clinic 3 months    Thank you for allowing me to participate in your patient's care and please do not hesitate to contact me for any questions or concerns.    Patrick North, DO  Assistant Professor of Neurology  [facility]     I personally spent a total of 60 minutes today preparing to see the patient, in the encounter with the patient, and documenting after the visit.    ==========================================================================================================================================    NAME:  Sabrina Morgan  DOB:  1996/03/23  VISIT DATE:  05/01/2022    CC:  Loss of consciousness events    Patient seen in consultation at the request of Hector Brunswick, FNP  History obtained from the patient and chart/records  Age of patient:  26 y.o.      HPI:   I had the pleasure of seeing your patient in neurology clinic for an outpatient consultation, who is a 26 y.o. year old female who was referred for evaluation of loss of consciousness events.  Please allow me to summarize the history for the record.    She states episodes began on July 26.  She woke up not feeling well.  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  She is also a full-time Engineer, manufacturing.  While she was at work she got a significant headache and became diaphoretic.  She checked into the ED where 1 of the providers diagnosed her with vertigo and discharged her.  She then went home.  That evening she was sitting on the couch with a friend when she developed lightheadedness and tunnel vision prior to blacking out.   She is unsure the exact time she was unconscious though reports that it was brief.  Upon awakening she was fatigued but otherwise alert and aware.  About 45 minutes following this episode, she had a 2nd similar episode, however, friend reported that the top half of her body was convulsing.  She then went to the ED intact well [place] and was transferred to [facility].  She was admitted to the ICU where she was on continuous EEG.  Per the reports, a similar event was not captured though she did have an aura without any change in EEG activity.  The rest of her EEG was likewise normal.  Prior to discharge she and her mother were told that these spells were psychogenic and she was referred to Psychiatry.  She continued to have spells on the way home and was taken to [facility] ED. she was then transferred to [facility] in [place].  There she reports having a routine EEG as well as an MRI with and without contrast.  Both of these were normal.  At some point, providers brought up the possibility of posterior reversible encephalopathy syndrome despite negative MRI.  She reports that her blood pressures were if anything on the lower end of normal.  She also reports an episode of bradycardia 28 as well as first-degree AV block on an EKG.  She was not seen by Cardiology during any of her hospitalizations.  She reports to significant traumatic life events leading up to the July episode.  She was engaged to be married in April, but the relationship ended in March.  About a month later her father had a significant heart attack and reportedly nearly passed away.  She was also working in [employer] and felt exhausted.  She has had 1 other event since her last discharge.  This occurred on August 15th.  She went to see her primary care provider earlier that day who would not clear her to return to work which caused her significant stress.  Since her discharge from [facility] doctors, she is been on Keppra  1000 mg twice daily.  She is noted some conjunctival injection of the right eye and some tension behind the right eye since starting the medication.  She is otherwise tolerating the medication well.    ============================================================================================================================================  PMHx  Patient Active Problem List   Diagnosis    Metabolic syndrome    Diabetes mellitus type 2, uncontrolled    Elevated hemoglobin A1c    Acanthosis nigricans, acquired    Morbid obesity (CMS HCC)    Irregular menses    Hepatic steatosis    Hepatomegaly    Elevated liver enzymes    Splenomegaly    Anxiety    Obesity (BMI 30-39.9)    Primary hypertension    Sprain of left wrist    Other sprain of right  shoulder joint, initial encounter    PRES (posterior reversible encephalopathy syndrome)    Other specified depressive episodes     Past Surgical History:   Procedure Laterality Date    HX CHOLECYSTECTOMY      HX KNEE SURGERY Right     3 arthroscopies    HX LAP CHOLECYSTECTOMY      HX TONSILLECTOMY      HX TYMPANOSTOMY      HX UPPER ENDOSCOPY      SLEEVE GASTROPLASTY           Family Medical History:       Problem Relation (Age of Onset)    Diabetes Father, Paternal Grandfather, Paternal 72, General Family Hx, Other    Digestive problems Mother    Healthy Brother    Obesity Mother    Other Mother, Father            Current Outpatient Medications   Medication Sig Dispense Refill    buPROPion (WELLBUTRIN SR) 150 mg Oral tablet sustained-release 12 hr Take 1 Tablet (150 mg total) by mouth Twice daily      busPIRone (BUSPAR) 7.5 mg Oral Tablet Take 1 Tablet (7.5 mg total) by mouth Twice per day as needed Take TWO tablets twice a day      levETIRAcetam (KEPPRA) 1,000 mg Oral Tablet Take 1 Tablet (1,000 mg total) by mouth Twice daily      lisinopriL (PRINIVIL) 10 mg Oral Tablet Take 1 Tablet (10 mg total) by mouth Once a day       No current facility-administered medications for this  visit.     Allergies   Allergen Reactions    Latex Rash    Dexamethasone Acetate Diarrhea, Nausea/ Vomiting and Hives/ Urticaria    Dexamethasone Sodium Phosphate Rash, Diarrhea and Nausea/ Vomiting     Social History     Socioeconomic History    Marital status: Single     Spouse name: Not on file    Number of children: Not on file    Years of education: Not on file    Highest education level: Not on file   Occupational History    Not on file   Tobacco Use    Smoking status: Never    Smokeless tobacco: Never   Vaping Use    Vaping Use: Never used   Substance and Sexual Activity    Alcohol use: Never    Drug use: Never    Sexual activity: Not on file   Other Topics Concern    Not on file   Social History Narrative    Not on file     Social Determinants of Health     Financial Resource Strain: Not on file   Transportation Needs: Not on file   Social Connections: Not on file   Intimate Partner Violence: Not on file   Housing Stability: Not on file       ============================================================================================================================================  GENERAL EXAMINATION  BP (!) 144/84 (Site: Left, Patient Position: Sitting, Cuff Size: Adult)   Pulse 76   Temp 36.8 C (98.3 F) (Temporal)   Ht 1.829 m (6')   Wt 134 kg (295 lb 3.2 oz)   SpO2 97%   BMI 40.04 kg/m       Vital signs personally reviewed  General: No acute distress, alert  HEENT: Normocephalic, no scleral icterus  Pulmonary: No accessory muscle use, no tachypnea  Cardiovascular: Heart with regular rate & rhythm  Extremities: No significant edema, No cyanosis  NEUROLOGIC EXAM  On neurological exam, patient was awake, alert and answering questions appropriately  Speech was fluent, without dysarthria or aphasia.    CN  II: not directly tested, grossly intact  III, IV, VI: extraocular movements intact without nystagmus  V: intact to light touch  VII: face symmetric without weakness  VIII: grossly intact  IX, X:  symmetric palatal elevation  XI: normal strength of trapezius and sternocleidomastoid bilaterally  XII: tongue midline with full movements    MOTOR  Bulk: normal  Tone: normal  Abnormal Movements:  Very low-amplitude high-frequency postural tremor in both hands    Strength:     MRC Grading Scale   Right Left   Deltoid 5 5   Biceps 5 5   Triceps 5 5   Wrist Extension 5 5   Wrist Flexion - -   Finger Extension 5 5   Finger Abduction 5 5   Finger Flexion 5 5   Hip Flexion 5 5   Hip Extension - -   Hip Abduction - -   Hip Adduction - -   Knee Extension 5 5   Knee Flexion 5 5   Ankle Dorsiflexion 5 5   Ankle Plantarflexion 5 5   Toe Extension 5 5   Toe Flexion - -     REFLEXES   Right Left   Biceps 2 2   Triceps 2 2   Brachioradialis 2 2   Patellar 2 2   Achilles 2 2   Plantar - -   Hoffman - -   Pectoralis - -   Jaw Jerk - -       SENSORY  Light touch: intact throughout    GAIT  General: casual, normal gait  Toe Walk: normal  Heel Walk: normal  Tandem: normal    COORDINATION  Finger nose finger: normal    ================================================================================================================================LABS  Personal Review of prior labs is notable for:   2023  BMP within normal limits, CBC largely within normal limits, lactate normal, CK 84, folate mildly low, B12 303, thiamine low normal, UDS negative  IMAGING  Personal Review of imaging is notable for:   Noncontrasted head CT October 26, 2021:  Largely unremarkable  CTA of the head and neck July 28th 2023:  Essentially normal study   OTHER DIAGNOSTICS  Personal Review of other prior diagnostics is notable for:  continuous EEG not available for direct review, however, per notes captured multiple events of concern with no EEG correlate

## 2022-05-15 ENCOUNTER — Telehealth (INDEPENDENT_AMBULATORY_CARE_PROVIDER_SITE_OTHER): Payer: Self-pay

## 2022-05-15 NOTE — Nursing Note (Signed)
Pt called asking if you could write a letter to release her to drive? 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 She states that she has had no HA's or any other symptoms for 60 days. 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

## 2022-05-15 NOTE — Telephone Encounter (Signed)
Notified pt mother per pt request that letter could not be written. She has a follow up with you in Dec. Mother states that she has had a few episodes and was in hospital/ER since the first one.

## 2022-05-18 ENCOUNTER — Telehealth (INDEPENDENT_AMBULATORY_CARE_PROVIDER_SITE_OTHER): Payer: Self-pay

## 2022-05-18 NOTE — Nursing Note (Signed)
Received VM from Judeth Horn, [place] stating that pt had a letter to return to work. Ms. Edilia Bo is needing clarification on the RTW day. The letter did not mention a date.

## 2022-05-18 NOTE — Nursing Note (Signed)
Verbal given that pt could return to work on 05/01/22, the date of the letter per Dr. Tobie Poet.

## 2022-07-18 ENCOUNTER — Emergency Department
Admission: EM | Admit: 2022-07-18 | Discharge: 2022-07-18 | Disposition: A | Discharge status: Home / Self Care | Payer: BC Managed Care – PPO | Attending: Emergency Medicine | Admitting: Emergency Medicine

## 2022-07-18 ENCOUNTER — Encounter (HOSPITAL_BASED_OUTPATIENT_CLINIC_OR_DEPARTMENT_OTHER): Payer: Self-pay

## 2022-07-18 ENCOUNTER — Other Ambulatory Visit: Payer: Self-pay

## 2022-07-18 DIAGNOSIS — I499 Cardiac arrhythmia, unspecified: Secondary | ICD-10-CM

## 2022-07-18 DIAGNOSIS — R519 Headache, unspecified: Secondary | ICD-10-CM | POA: Insufficient documentation

## 2022-07-18 DIAGNOSIS — I10 Essential (primary) hypertension: Secondary | ICD-10-CM | POA: Insufficient documentation

## 2022-07-18 LAB — ECG 12 LEAD
Atrial Rate: 65 {beats}/min
Calculated P Axis: 20 degrees
Calculated R Axis: 37 degrees
Calculated T Axis: 42 degrees
PR Interval: 196 ms
QRS Duration: 76 ms
QT Interval: 408 ms
QTC Calculation: 424 ms
Ventricular rate: 65 {beats}/min

## 2022-07-18 MED ORDER — TRAMADOL 50 MG TABLET
100.0000 mg | ORAL_TABLET | ORAL | Status: AC
Start: 2022-07-18 — End: 2022-07-18
  Administered 2022-07-18: 100 mg via ORAL

## 2022-07-18 MED ORDER — KETOROLAC 60 MG/2 ML INTRAMUSCULAR SOLUTION
60.0000 mg | INTRAMUSCULAR | Status: AC
Start: 2022-07-18 — End: 2022-07-18
  Administered 2022-07-18: 60 mg via INTRAMUSCULAR

## 2022-07-18 MED ORDER — FUROSEMIDE 40 MG TABLET
40.0000 mg | ORAL_TABLET | ORAL | Status: AC
Start: 2022-07-18 — End: 2022-07-18
  Administered 2022-07-18: 40 mg via ORAL

## 2022-07-18 MED ORDER — LISINOPRIL 20 MG TABLET
ORAL_TABLET | ORAL | Status: AC
Start: 2022-07-18 — End: 2022-07-18
  Filled 2022-07-18: qty 1

## 2022-07-18 MED ORDER — KETOROLAC 60 MG/2 ML INTRAMUSCULAR SOLUTION
INTRAMUSCULAR | Status: AC
Start: 2022-07-18 — End: 2022-07-18
  Filled 2022-07-18: qty 2

## 2022-07-18 MED ORDER — AMLODIPINE 5 MG TABLET
5.0000 mg | ORAL_TABLET | ORAL | Status: AC
Start: 2022-07-18 — End: 2022-07-18
  Administered 2022-07-18: 5 mg via ORAL

## 2022-07-18 MED ORDER — AMLODIPINE 5 MG TABLET
ORAL_TABLET | ORAL | Status: AC
Start: 2022-07-18 — End: 2022-07-18
  Filled 2022-07-18: qty 1

## 2022-07-18 MED ORDER — LISINOPRIL 5 MG TABLET
10.0000 mg | ORAL_TABLET | ORAL | Status: AC
Start: 2022-07-18 — End: 2022-07-18
  Administered 2022-07-18: 10 mg via ORAL

## 2022-07-18 MED ORDER — AMLODIPINE 5 MG TABLET
5.0000 mg | ORAL_TABLET | Freq: Every day | ORAL | 4 refills | Status: DC
Start: 2022-07-18 — End: 2023-11-30

## 2022-07-18 MED ORDER — KETOROLAC 10 MG TABLET
10.0000 mg | ORAL_TABLET | Freq: Four times a day (QID) | ORAL | 0 refills | Status: DC | PRN
Start: 2022-07-18 — End: 2022-12-26

## 2022-07-18 MED ORDER — TRAMADOL 50 MG TABLET
1.0000 | ORAL_TABLET | Freq: Four times a day (QID) | ORAL | 0 refills | Status: AC | PRN
Start: 2022-07-18 — End: 2022-07-28

## 2022-07-18 MED ORDER — TRAMADOL 50 MG TABLET
ORAL_TABLET | ORAL | Status: AC
Start: 2022-07-18 — End: 2022-07-18
  Filled 2022-07-18: qty 1

## 2022-07-18 MED ORDER — FUROSEMIDE 40 MG TABLET
ORAL_TABLET | ORAL | Status: AC
Start: 2022-07-18 — End: 2022-07-18
  Filled 2022-07-18: qty 1

## 2022-07-18 NOTE — ED Provider Notes (Signed)
[facility], [facility] Emergency Department  ED Primary Provider Note  History of Present Illness   Chief Complaint   Patient presents with    Hypertension     Sabrina Morgan is a 26 y.o. female who had concerns including Hypertension.  Arrival: The patient arrived by Car complaining about her blood pressure being elevated since last night.  She stated pressure last night was 185 over 114.  This morning when she woke up her pressure was 140/113.  Patient arrived in the ER and presently has a blood pressure of 141/81.  Patient did not take her blood pressure meds this morning.  Patient denies any chest pain or shortness breath.  Patient is complaining of a headache right-sided frontal area since yesterday.  She thinks her severe headache is making her blood pressure go up.  No fever chills.  No myalgias or arthralgias.      HPI  Review of Systems   Review of Systems   Constitutional:  Positive for activity change. Negative for chills and fever.   HENT:  Negative for ear pain and sore throat.    Eyes:  Negative for pain and visual disturbance.   Respiratory:  Negative for cough and shortness of breath.    Cardiovascular:  Negative for chest pain and palpitations.   Gastrointestinal:  Negative for abdominal pain and vomiting.   Genitourinary:  Negative for dysuria and hematuria.   Musculoskeletal:  Negative for arthralgias and back pain.   Skin:  Negative for color change and rash.   Neurological:  Positive for headaches. Negative for seizures and syncope.   All other systems reviewed and are negative.     Historical Data   History Reviewed This Encounter:     Physical Exam   ED Triage Vitals [07/18/22 0843]   BP (Non-Invasive) (!) 143/94   Heart Rate 82   Respiratory Rate 16   Temperature (!) 35.7 C (96.2 F)   SpO2 100 %   Weight 127 kg (280 lb)   Height 1.829 m (6')     Physical Exam  Vitals and nursing note reviewed.   Constitutional:       General: She is not in acute  distress.     Appearance: She is well-developed. She is obese.   HENT:      Head: Normocephalic and atraumatic.      Right Ear: External ear normal.      Left Ear: External ear normal.      Nose: Nose normal.      Mouth/Throat:      Mouth: Mucous membranes are moist.   Eyes:      Extraocular Movements: Extraocular movements intact.      Conjunctiva/sclera: Conjunctivae normal.      Pupils: Pupils are equal, round, and reactive to light.   Cardiovascular:      Rate and Rhythm: Normal rate and regular rhythm.      Pulses: Normal pulses.      Heart sounds: Normal heart sounds. No murmur heard.  Pulmonary:      Effort: Pulmonary effort is normal. No respiratory distress.      Breath sounds: Normal breath sounds.   Abdominal:      General: Bowel sounds are normal.      Palpations: Abdomen is soft.      Tenderness: There is no abdominal tenderness.   Musculoskeletal:         General: No swelling. Normal range of motion.  Cervical back: Normal range of motion and neck supple.   Skin:     General: Skin is warm and dry.      Capillary Refill: Capillary refill takes less than 2 seconds.   Neurological:      General: No focal deficit present.      Mental Status: She is alert and oriented to person, place, and time.   Psychiatric:         Mood and Affect: Mood normal.         Behavior: Behavior normal.         Thought Content: Thought content normal.         Judgment: Judgment normal.       Patient Data   Labs Ordered/Reviewed - No data to display  No orders to display     Medical Decision Making        Medical Decision Making  Patient is 26 year old white female complaining elevated blood pressures since last night.  Patient states the pressure was 184114.  She is been complaining of severe headache right frontal area since yesterday.  Patient awoke this morning with 140 over 112.  Patient came to the ER in her pressure was 141/80.  Patient still has headache on the right upper forehead.  Patient denies any numbness or  tingling in the extremity.  Patient denies any fever or chills.  No sore throat or earache.  Patient denies any chest pain or shortness of breath.  Patient will have her lisinopril taken this morning as well as 40 of Lasix as a diuretic.  Patient will also Toradol for headache in an EKG.  We will wait to see how the medication works on her blood pressure as well as her headache.    Amount and/or Complexity of Data Reviewed  ECG/medicine tests: ordered.     Details: Normal sinus rhythm 65, PR interval 196 MS, QT interval 408 MS, normal EKG.    Risk  Prescription drug management.             Medications Administered in the ED   lisinopril (PRINIVIL) tablet (10 mg Oral Given 07/18/22 0900)   furosemide (LASIX) tablet (40 mg Oral Given 07/18/22 0900)   ketorolac (TORADOL) 60mg /2 mL IM injection (60 mg IntraMUSCULAR Given 07/18/22 0902)   traMADol (ULTRAM) tablet (100 mg Oral Given 07/18/22 1036)   amLODIPine (NORVASC) tablet (5 mg Oral Given 07/18/22 1138)     Clinical Impression   Cephalgia (Primary)   Accelerated hypertension       Disposition: Discharged               Clinical Impression   Cephalgia (Primary)   Accelerated hypertension       Current Discharge Medication List        START taking these medications    Details   amLODIPine (NORVASC) 5 mg Oral Tablet Take 1 Tablet (5 mg total) by mouth Once a day  Qty: 90 Tablet, Refills: 4      ketorolac tromethamine (TORADOL) 10 mg Oral Tablet Take 1 Tablet (10 mg total) by mouth Every 6 hours as needed for Pain  Qty: 20 Tablet, Refills: 0      traMADoL (ULTRAM) 50 mg Oral Tablet Take 1 Tablet (50 mg total) by mouth Every 6 hours as needed for Pain for up to 10 days  Qty: 20 Tablet, Refills: 0

## 2022-07-18 NOTE — ED Nurses Note (Signed)
Patient reports right sided facial tingling and decreased sensation. Provider informed, no new orders. Patient also reports h/a worsening, "worst headache in a long time". Medication order to be placed by provider.

## 2022-07-18 NOTE — ED Nurses Note (Signed)
Patient reports facial numbness and "dropping". Right eye slightly closed. States headache is getting worse. Dr. Scarlett Presto made aware and orders received. See MAR.

## 2022-07-18 NOTE — ED Triage Notes (Signed)
Blood pressure elevated since last night + headache. BP last night 195/125. Took extra dose metoprolol at 0400, BP still elevated, pain left upper chest

## 2022-07-18 NOTE — ED Nurses Note (Signed)
Patient reports feeling "better, but tired". Discharge instructions given to and went over with patient. Understanding stated. Ambulated off unit without problems.

## 2022-07-24 ENCOUNTER — Ambulatory Visit (INDEPENDENT_AMBULATORY_CARE_PROVIDER_SITE_OTHER): Payer: BC Managed Care – PPO | Admitting: NEUROLOGY

## 2022-07-24 ENCOUNTER — Encounter (INDEPENDENT_AMBULATORY_CARE_PROVIDER_SITE_OTHER): Payer: Self-pay | Admitting: NEUROLOGY

## 2022-07-24 ENCOUNTER — Other Ambulatory Visit: Payer: Self-pay

## 2022-07-24 VITALS — BP 152/88 | HR 88 | Temp 98.4°F | Wt 300.0 lb

## 2022-07-24 DIAGNOSIS — F445 Conversion disorder with seizures or convulsions: Secondary | ICD-10-CM

## 2022-07-24 NOTE — Progress Notes (Signed)
ASSESSMENT  1. Loss of consciousness events:  She is a 26 year old woman who is referred for multiple episodes of altered consciousness.  Her neurologic exam is normal.  She has had extensive workup both at [facility] as well as [facility] in [place].  This has included continuous EEG monitoring which showed no epileptiform activity.  Unfortunately an event was not captured, however, she did report a similar aura while she was hooked up without any changes on EEG.  She is also had a routine EEG at [facility] doctors which was reportedly normal.  She underwent an MRI of the brain with and without contrast which was unremarkable.  The only significant findings from her workups have been a significant episode of bradycardia as well as first-degree AV block on EKG.  While these events are somewhat stereotyped, there are few other features that are concerning for true epileptiform activity.  Even though the event was not captured, she did have continuous EEG monitoring shortly after having multiple back-to-back events, so I would have expected to see some abnormality indicating a lowered seizure threshold.  The fact that these events also did not begin until after 2 traumatic life events is more suggestive psychogenic nonepileptic seizures though stress can make both PNES and epilepsy worse. She has remained event free after being off any anti-seizure medication. 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 She has returned to work and is doing well. She is requesting a note to be able to return to driving. As I do not believe her episodes are the result of epilepsy and she has been event free for 5 months, I think it is safe to return to driving.   2. Headache:  These are relatively new in onset.  She feels that these have improved in recent months and are not very troublesome to her.     PLAN  Note provided to return to driving  2.   Continue to monitor    RTC PRN    Thank you for allowing me to  participate in your patient's care and please do not hesitate to contact me for any questions or concerns.    Patrick North, DO  Assistant Professor of Neurology  [facility]     ==========================================================================================================================================    NAME:  Sabrina Morgan  DOB:  1996-01-21  VISIT DATE:  05/01/2022    CC:  Loss of consciousness events    Patient seen in consultation at the request of Hector Brunswick, FNP  History obtained from the patient and chart/records  Age of patient:  25 y.o.    INTERVAL: She has been off Keppra since her last appointment and has had no further concerning events. She has been doing well with work and is requesting a note to return to driving. Headaches have also improved and are not very bothersome to her.     HPI:   I had the pleasure of seeing your patient in neurology clinic for an outpatient consultation, who is a 26 y.o. year old female who was referred for evaluation of loss of consciousness events.  Please allow me to summarize the history for the record.    She states episodes began on July 26.  She woke up not feeling well.  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  She is also a full-time Buyer, retail.  While she was at work she got a significant headache and became diaphoretic.  She  checked into the ED where 1 of the providers diagnosed her with vertigo and discharged her.  She then went home.  That evening she was sitting on the couch with a friend when she developed lightheadedness and tunnel vision prior to blacking out.  She is unsure the exact time she was unconscious though reports that it was brief.  Upon awakening she was fatigued but otherwise alert and aware.  About 45 minutes following this episode, she had a 2nd similar episode, however, friend reported that the top half of her body was convulsing.  She then went to the ED intact  well [place] and was transferred to [facility].  She was admitted to the ICU where she was on continuous EEG.  Per the reports, a similar event was not captured though she did have an aura without any change in EEG activity.  The rest of her EEG was likewise normal.  Prior to discharge she and her mother were told that these spells were psychogenic and she was referred to Psychiatry.  She continued to have spells on the way home and was taken to [facility] ED. she was then transferred to [facility] doctors in [place].  There she reports having a routine EEG as well as an MRI with and without contrast.  Both of these were normal.  At some point, providers brought up the possibility of posterior reversible encephalopathy syndrome despite negative MRI.  She reports that her blood pressures were if anything on the lower end of normal.  She also reports an episode of bradycardia 28 as well as first-degree AV block on an EKG.  She was not seen by Cardiology during any of her hospitalizations.  She reports to significant traumatic life events leading up to the July episode.  She was engaged to be married in April, but the relationship ended in March.  About a month later her father had a significant heart attack and reportedly nearly passed away.  She was also working in Museum/gallery exhibitions officer and felt exhausted.  She has had 1 other event since her last discharge.  This occurred on August 15th.  She went to see her primary care provider earlier that day who would not clear her to return to work which caused her significant stress.  Since her discharge from [facility] doctors, she is been on Keppra 1000 mg twice daily.  She is noted some conjunctival injection of the right eye and some tension behind the right eye since starting the medication.  She is otherwise tolerating the medication  well.    ============================================================================================================================================  PMHx  Patient Active Problem List   Diagnosis    Metabolic syndrome    Diabetes mellitus type 2, uncontrolled    Elevated hemoglobin A1c    Acanthosis nigricans, acquired    Morbid obesity (CMS HCC)    Irregular menses    Hepatic steatosis    Hepatomegaly    Elevated liver enzymes    Splenomegaly    Anxiety    Obesity (BMI 30-39.9)    Primary hypertension    Sprain of left wrist    Other sprain of right shoulder joint, initial encounter    Other specified depressive episodes    Loss of consciousness (CMS HCC)     Past Surgical History:   Procedure Laterality Date    HX CHOLECYSTECTOMY      HX KNEE SURGERY Right     3 arthroscopies    HX LAP CHOLECYSTECTOMY      HX TONSILLECTOMY  HX TYMPANOSTOMY      HX UPPER ENDOSCOPY      SLEEVE GASTROPLASTY           Family Medical History:       Problem Relation (Age of Onset)    Diabetes Father, Paternal Grandfather, Paternal Aunt, General Family Hx, Other    Digestive problems Mother    Healthy Brother    Obesity Mother    Other Mother, Father            Current Outpatient Medications   Medication Sig Dispense Refill    amLODIPine (NORVASC) 5 mg Oral Tablet Take 1 Tablet (5 mg total) by mouth Once a day 90 Tablet 4    buPROPion (WELLBUTRIN SR) 150 mg Oral tablet sustained-release 12 hr Take 1 Tablet (150 mg total) by mouth Twice daily      busPIRone (BUSPAR) 7.5 mg Oral Tablet Take 1 Tablet (7.5 mg total) by mouth Twice per day as needed Take TWO tablets twice a day      ketorolac tromethamine (TORADOL) 10 mg Oral Tablet Take 1 Tablet (10 mg total) by mouth Every 6 hours as needed for Pain 20 Tablet 0    lisinopriL (PRINIVIL) 10 mg Oral Tablet Take 1 Tablet (10 mg total) by mouth Once a day      traMADoL (ULTRAM) 50 mg Oral Tablet Take 1 Tablet (50 mg total) by mouth Every 6 hours as needed for Pain for up to 10 days 20  Tablet 0     No current facility-administered medications for this visit.     Allergies   Allergen Reactions    Latex Rash    Dexamethasone Acetate Diarrhea, Nausea/ Vomiting and Hives/ Urticaria    Dexamethasone Sodium Phosphate Rash, Diarrhea and Nausea/ Vomiting     Social History     Socioeconomic History    Marital status: Single     Spouse name: Not on file    Number of children: Not on file    Years of education: Not on file    Highest education level: Not on file   Occupational History    Not on file   Tobacco Use    Smoking status: Never    Smokeless tobacco: Never   Vaping Use    Vaping Use: Never used   Substance and Sexual Activity    Alcohol use: Not Currently     Comment: rare    Drug use: Never    Sexual activity: Not on file   Other Topics Concern    Not on file   Social History Narrative    Not on file     Social Determinants of Health     Financial Resource Strain: Not on file   Transportation Needs: Not on file   Social Connections: Not on file   Intimate Partner Violence: Not on file   Housing Stability: Not on file       ============================================================================================================================================  GENERAL EXAMINATION  BP (!) 152/88 (Site: Left, Patient Position: Sitting, Cuff Size: Adult Large)   Pulse 88   Temp 36.9 C (98.4 F) (Temporal)   Wt 136 kg (300 lb)   LMP 07/12/2022   SpO2 98%   BMI 40.69 kg/m     Vital signs personally reviewed  General: No acute distress, alert  HEENT: Normocephalic, no scleral icterus  Extremities: No significant edema, No cyanosis    NEUROLOGIC EXAM  On neurological exam, patient was awake, alert and answering questions appropriately  Speech was fluent,  without dysarthria or aphasia.    CN  II: not directly tested, grossly intact  III, IV, VI: extraocular movements intact without nystagmus  V: intact to light touch  VII: face symmetric without weakness  VIII: grossly intact  IX, X: symmetric  palatal elevation  XI: normal strength of trapezius and sternocleidomastoid bilaterally  XII: tongue midline with full movements    MOTOR  Bulk: normal    Strength:     MRC Grading Scale   Right Left   Deltoid 5 5   Biceps 5 5   Triceps 5 5   Wrist Extension - -   Wrist Flexion - -   Finger Extension - -   Finger Abduction - -   Finger Flexion - -   Hip Flexion 5 5   Hip Extension - -   Hip Abduction - -   Hip Adduction - -   Knee Extension 5 5   Knee Flexion 5 5   Ankle Dorsiflexion - -   Ankle Plantarflexion - -   Toe Extension - -   Toe Flexion - -     REFLEXES   Right Left   Biceps 2 2   Triceps 2 2   Brachioradialis 2 2   Patellar 2 2   Achilles 2 2   Plantar - -   Hoffman - -   Pectoralis - -   Jaw Jerk - -       SENSORY  Light touch: intact throughout    GAIT  General: casual, normal gait    ================================================================================================================================LABS  Personal Review of prior labs is notable for:   2023  BMP within normal limits, CBC largely within normal limits, lactate normal, CK 84, folate mildly low, B12 303, thiamine low normal, UDS negative  IMAGING  Personal Review of imaging is notable for:   Noncontrasted head CT October 26, 2021:  Largely unremarkable  CTA of the head and neck July 28th 2023:  Essentially normal study   OTHER DIAGNOSTICS  Personal Review of other prior diagnostics is notable for:  continuous EEG not available for direct review, however, per notes captured multiple events of concern with no EEG correlate

## 2022-09-12 ENCOUNTER — Emergency Department (EMERGENCY_DEPARTMENT_HOSPITAL)
Admission: EM | Admit: 2022-09-12 | Discharge: 2022-09-12 | Disposition: A | Discharge status: Home / Self Care | Payer: BC Managed Care – PPO | Source: Home / Self Care | Attending: Emergency Medicine | Admitting: Emergency Medicine

## 2022-09-12 ENCOUNTER — Emergency Department (HOSPITAL_BASED_OUTPATIENT_CLINIC_OR_DEPARTMENT_OTHER): Payer: BC Managed Care – PPO

## 2022-09-12 ENCOUNTER — Emergency Department
Admission: EM | Admit: 2022-09-12 | Discharge: 2022-09-12 | Disposition: A | Discharge status: Home / Self Care | Payer: BC Managed Care – PPO | Attending: Emergency Medicine | Admitting: Emergency Medicine

## 2022-09-12 ENCOUNTER — Other Ambulatory Visit: Payer: Self-pay

## 2022-09-12 ENCOUNTER — Inpatient Hospital Stay (HOSPITAL_COMMUNITY): Payer: BC Managed Care – PPO

## 2022-09-12 DIAGNOSIS — R9431 Abnormal electrocardiogram [ECG] [EKG]: Secondary | ICD-10-CM

## 2022-09-12 DIAGNOSIS — Z1152 Encounter for screening for COVID-19: Secondary | ICD-10-CM | POA: Insufficient documentation

## 2022-09-12 DIAGNOSIS — R079 Chest pain, unspecified: Secondary | ICD-10-CM

## 2022-09-12 DIAGNOSIS — J189 Pneumonia, unspecified organism: Secondary | ICD-10-CM | POA: Insufficient documentation

## 2022-09-12 DIAGNOSIS — R091 Pleurisy: Secondary | ICD-10-CM | POA: Insufficient documentation

## 2022-09-12 LAB — ECG 12 LEAD
Atrial Rate: 68 {beats}/min
Atrial Rate: 75 {beats}/min
Calculated P Axis: 47 degrees
Calculated P Axis: 30 degrees
Calculated R Axis: 70 degrees
Calculated R Axis: 33 degrees
Calculated T Axis: 29 degrees
Calculated T Axis: 54 degrees
PR Interval: 196 ms
PR Interval: 184 ms
QRS Duration: 74 ms
QRS Duration: 70 ms
QT Interval: 402 ms
QT Interval: 374 ms
QTC Calculation: 427 ms
QTC Calculation: 417 ms
Ventricular rate: 68 {beats}/min
Ventricular rate: 75 {beats}/min

## 2022-09-12 LAB — COMPREHENSIVE METABOLIC PANEL, NON-FASTING
ALBUMIN/GLOBULIN RATIO: 1.2 (ref 0.8–1.4)
ALBUMIN/GLOBULIN RATIO: 0.9 (ref 0.8–1.4)
ALBUMIN: 4.1 g/dL (ref 3.5–5.7)
ALBUMIN: 3.9 g/dL (ref 3.4–5.0)
ALKALINE PHOSPHATASE: 69 U/L (ref 34–104)
ALKALINE PHOSPHATASE: 93 U/L (ref 46–116)
ALT (SGPT): 20 U/L (ref 7–52)
ALT (SGPT): 21 U/L (ref <=78)
ANION GAP: 7 mmol/L (ref 4–13)
ANION GAP: 10 mmol/L (ref 4–13)
AST (SGOT): 19 U/L (ref 13–39)
AST (SGOT): 21 U/L (ref 15–37)
BILIRUBIN TOTAL: 0.3 mg/dL (ref 0.3–1.2)
BILIRUBIN TOTAL: 0.5 mg/dL (ref 0.2–1.0)
BUN/CREA RATIO: 17 (ref 6–22)
BUN/CREA RATIO: 16
BUN: 13 mg/dL (ref 7–25)
BUN: 12 mg/dL (ref 7–18)
CALCIUM, CORRECTED: 9.4 mg/dL (ref 8.9–10.8)
CALCIUM, CORRECTED: 9.5 mg/dL
CALCIUM: 9.5 mg/dL (ref 8.6–10.3)
CALCIUM: 9.4 mg/dL (ref 8.5–10.1)
CHLORIDE: 107 mmol/L (ref 98–107)
CHLORIDE: 104 mmol/L (ref 98–107)
CO2 TOTAL: 26 mmol/L (ref 21–31)
CO2 TOTAL: 27 mmol/L (ref 21–32)
CREATININE: 0.77 mg/dL (ref 0.60–1.30)
CREATININE: 0.77 mg/dL (ref 0.55–1.02)
ESTIMATED GFR: 110 mL/min/{1.73_m2} (ref >59)
ESTIMATED GFR: 110 mL/min/{1.73_m2} (ref >59)
GLOBULIN: 3.4 (ref 2.9–5.4)
GLOBULIN: 4.5
GLUCOSE: 96 mg/dL (ref 74–109)
GLUCOSE: 100 mg/dL (ref 74–106)
OSMOLALITY, CALCULATED: 279 mOsm/kg (ref 270–290)
OSMOLALITY, CALCULATED: 281 mOsm/kg (ref 270–290)
POTASSIUM: 3.6 mmol/L (ref 3.5–5.1)
POTASSIUM: 3.4 mmol/L — ABNORMAL LOW (ref 3.5–5.1)
PROTEIN TOTAL: 7.5 g/dL (ref 6.4–8.9)
PROTEIN TOTAL: 8.4 g/dL — ABNORMAL HIGH (ref 6.4–8.2)
SODIUM: 140 mmol/L (ref 136–145)
SODIUM: 141 mmol/L (ref 136–145)

## 2022-09-12 LAB — COVID-19, FLU A/B, RSV RAPID BY PCR
INFLUENZA VIRUS TYPE A: NOT DETECTED
INFLUENZA VIRUS TYPE B: NOT DETECTED
RESPIRATORY SYNCTIAL VIRUS (RSV): NOT DETECTED
SARS-CoV-2: NOT DETECTED

## 2022-09-12 LAB — LACTIC ACID LEVEL W/ REFLEX FOR LEVEL >2.0
LACTIC ACID: 0.6 mmol/L (ref 0.5–2.2)
LACTIC ACID: 1.7 mmol/L (ref 0.4–2.0)

## 2022-09-12 LAB — HCG QUALITATIVE PREGNANCY, SERUM: PREGNANCY, SERUM QUALITATIVE: NEGATIVE

## 2022-09-12 LAB — CBC WITH DIFF
BASOPHIL #: 0 10*3/uL (ref 0.00–0.10)
BASOPHIL #: 0.02 10*3/uL (ref 0.00–0.30)
BASOPHIL %: 0 % (ref 0–1)
BASOPHIL %: 0 % (ref 0–3)
EOSINOPHIL #: 0.1 10*3/uL (ref 0.00–0.50)
EOSINOPHIL #: 0.08 10*3/uL (ref 0.00–0.80)
EOSINOPHIL %: 1 %
EOSINOPHIL %: 1 % (ref 0–7)
HCT: 38 % (ref 31.2–41.9)
HCT: 40.5 % (ref 37.0–47.0)
HGB: 12.7 g/dL (ref 10.9–14.3)
HGB: 13.8 g/dL (ref 12.5–16.0)
LYMPHOCYTE #: 3.1 10*3/uL — ABNORMAL HIGH (ref 1.00–3.00)
LYMPHOCYTE #: 1.66 10*3/uL (ref 1.10–5.00)
LYMPHOCYTE %: 28 % (ref 16–44)
LYMPHOCYTE %: 22 % — ABNORMAL LOW (ref 25–45)
MCH: 28.7 pg (ref 24.7–32.8)
MCH: 29.7 pg (ref 27.0–32.0)
MCHC: 33.6 g/dL (ref 32.3–35.6)
MCHC: 34.1 g/dL (ref 32.0–36.0)
MCV: 85.4 fL (ref 75.5–95.3)
MCV: 87 fL (ref 78.0–99.0)
MONOCYTE #: 0.6 10*3/uL (ref 0.30–1.00)
MONOCYTE #: 0.53 10*3/uL (ref 0.00–1.30)
MONOCYTE %: 5 % (ref 5–13)
MONOCYTE %: 7 % (ref 0–12)
MPV: 8 fL (ref 7.9–10.8)
MPV: 7.9 fL (ref 7.4–10.4)
NEUTROPHIL #: 7.4 10*3/uL (ref 1.85–7.80)
NEUTROPHIL #: 5.17 10*3/uL (ref 1.80–8.40)
NEUTROPHIL %: 66 % (ref 43–77)
NEUTROPHIL %: 69 % (ref 40–76)
PLATELETS: 318 10*3/uL (ref 140–440)
PLATELETS: 315 10*3/uL (ref 140–440)
RBC: 4.44 10*6/uL (ref 3.63–4.92)
RBC: 4.65 10*6/uL (ref 4.20–5.40)
RDW: 13.8 % (ref 12.3–17.7)
RDW: 15.2 % — ABNORMAL HIGH (ref 11.6–14.8)
WBC: 11.2 10*3/uL (ref 3.8–11.8)
WBC: 7.5 10*3/uL (ref 4.0–10.5)

## 2022-09-12 LAB — D-DIMER: D-DIMER: <215 ng/mL DDU (ref <=232)

## 2022-09-12 LAB — TROPONIN-I: TROPONIN I: <2 ng/L (ref <15)

## 2022-09-12 LAB — C-REACTIVE PROTEIN (CRP): C-REACTIVE PROTEIN (CRP): 0.8 mg/dL — ABNORMAL HIGH (ref 0.1–0.5)

## 2022-09-12 MED ORDER — DOXYCYCLINE HYCLATE 100 MG CAPSULE
100.0000 mg | ORAL_CAPSULE | Freq: Two times a day (BID) | ORAL | 0 refills | Status: AC
Start: 2022-09-12 — End: 2022-09-22

## 2022-09-12 MED ORDER — SODIUM CHLORIDE 0.9 % INTRAVENOUS PIGGYBACK
INJECTION | INTRAVENOUS | Status: AC
Start: 2022-09-12 — End: 2022-09-12
  Filled 2022-09-12: qty 50

## 2022-09-12 MED ORDER — ALBUTEROL SULFATE HFA 90 MCG/ACTUATION AEROSOL INHALER
1.0000 | INHALATION_SPRAY | Freq: Four times a day (QID) | RESPIRATORY_TRACT | 0 refills | Status: DC | PRN
Start: 2022-09-12 — End: 2022-12-26

## 2022-09-12 MED ORDER — HYDRALAZINE 20 MG/ML INJECTION SOLUTION
INTRAMUSCULAR | Status: AC
Start: 2022-09-12 — End: 2022-09-12
  Filled 2022-09-12: qty 1

## 2022-09-12 MED ORDER — SODIUM CHLORIDE 0.9 % (FLUSH) INJECTION SYRINGE
3.0000 mL | INJECTION | INTRAMUSCULAR | Status: DC | PRN
Start: 2022-09-12 — End: 2022-09-12

## 2022-09-12 MED ORDER — METHOCARBAMOL 750 MG TABLET
ORAL_TABLET | ORAL | Status: AC
Start: 2022-09-12 — End: 2022-09-12
  Filled 2022-09-12: qty 2

## 2022-09-12 MED ORDER — SODIUM CHLORIDE 0.9 % INTRAVENOUS PIGGYBACK
1.0000 g | INTRAVENOUS | Status: AC
Start: 2022-09-12 — End: 2022-09-12
  Administered 2022-09-12: 0 g via INTRAVENOUS
  Administered 2022-09-12: 1 g via INTRAVENOUS

## 2022-09-12 MED ORDER — SODIUM CHLORIDE 0.9 % IV BOLUS
1000.0000 mL | INJECTION | Status: AC
Start: 2022-09-12 — End: 2022-09-12
  Administered 2022-09-12: 0 mL via INTRAVENOUS
  Administered 2022-09-12: 1000 mL via INTRAVENOUS

## 2022-09-12 MED ORDER — CEFTRIAXONE 1 GRAM SOLUTION FOR INJECTION
INTRAMUSCULAR | Status: AC
Start: 2022-09-12 — End: 2022-09-12
  Filled 2022-09-12: qty 10

## 2022-09-12 MED ORDER — METHOCARBAMOL 750 MG TABLET
1500.0000 mg | ORAL_TABLET | Freq: Once | ORAL | Status: AC
Start: 2022-09-12 — End: 2022-09-12
  Administered 2022-09-12: 1500 mg via ORAL

## 2022-09-12 MED ORDER — SODIUM CHLORIDE 0.9 % (FLUSH) INJECTION SYRINGE
3.0000 mL | INJECTION | Freq: Three times a day (TID) | INTRAMUSCULAR | Status: DC
Start: 2022-09-12 — End: 2022-09-12

## 2022-09-12 MED ORDER — METHYLPREDNISOLONE SOD SUCC 125 MG SOLUTION FOR INJECTION WRAPPER
125.0000 mg | INTRAVENOUS | Status: AC
Start: 2022-09-12 — End: 2022-09-12
  Administered 2022-09-12: 125 mg via INTRAVENOUS

## 2022-09-12 MED ORDER — METHYLPREDNISOLONE SOD SUCC 125 MG SOLUTION FOR INJECTION WRAPPER
INTRAVENOUS | Status: AC
Start: 2022-09-12 — End: 2022-09-12
  Filled 2022-09-12: qty 2

## 2022-09-12 MED ORDER — KETOROLAC 30 MG/ML (1 ML) INJECTION SOLUTION
30.0000 mg | INTRAMUSCULAR | Status: AC
Start: 2022-09-12 — End: 2022-09-12
  Administered 2022-09-12: 30 mg via INTRAVENOUS

## 2022-09-12 MED ORDER — KETOROLAC 30 MG/ML (1 ML) INJECTION SOLUTION
INTRAMUSCULAR | Status: AC
Start: 2022-09-12 — End: 2022-09-12
  Filled 2022-09-12: qty 1

## 2022-09-12 MED ORDER — HYDRALAZINE 20 MG/ML INJECTION SOLUTION
5.0000 mg | INTRAMUSCULAR | Status: DC
Start: 2022-09-12 — End: 2022-09-12

## 2022-09-12 MED ORDER — IPRATROPIUM 0.5 MG-ALBUTEROL 3 MG (2.5 MG BASE)/3 ML NEBULIZATION SOLN
INHALATION_SOLUTION | RESPIRATORY_TRACT | Status: AC
Start: 2022-09-12 — End: 2022-09-12
  Filled 2022-09-12: qty 3

## 2022-09-12 MED ORDER — POTASSIUM BICARBONATE-CITRIC ACID 25 MEQ EFFERVESCENT TABLET
EFFERVESCENT_TABLET | ORAL | Status: AC
Start: 2022-09-12 — End: 2022-09-12
  Filled 2022-09-12: qty 1

## 2022-09-12 MED ORDER — POTASSIUM BICARBONATE-CITRIC ACID 25 MEQ EFFERVESCENT TABLET
25.0000 meq | EFFERVESCENT_TABLET | ORAL | Status: AC
Start: 2022-09-12 — End: 2022-09-12
  Administered 2022-09-12: 25 meq via ORAL

## 2022-09-12 MED ORDER — METHYLPREDNISOLONE 4 MG TABLETS IN A DOSE PACK
ORAL_TABLET | ORAL | 0 refills | Status: DC
Start: 2022-09-12 — End: 2022-12-26

## 2022-09-12 MED ORDER — IPRATROPIUM 0.5 MG-ALBUTEROL 3 MG (2.5 MG BASE)/3 ML NEBULIZATION SOLN
3.0000 mL | INHALATION_SOLUTION | RESPIRATORY_TRACT | Status: AC
Start: 2022-09-12 — End: 2022-09-12
  Administered 2022-09-12: 3 mL via RESPIRATORY_TRACT

## 2022-09-12 NOTE — ED Provider Notes (Signed)
Sabrina Morgan  Emergency Department  Attending Provider Note      CHIEF COMPLAINT  Chief Complaint   Patient presents with    Chest Pain      HISTORY OF PRESENT ILLNESS  Sabrina Morgan, date of birth [birth year], is a 27 y.o. female who presented to the Emergency Department chest pain and shortness of breath.  Symptoms began earlier tonight.     PAST MEDICAL/SURGICAL/FAMILY/SOCIAL HISTORY  Past Medical History:   Diagnosis Date    Gallbladder disease     Nonalcoholic hepatosteatosis     Obesity, unspecified     Prediabetes     Primary hypertension 12/23/2013    Seizure (CMS HCC)        Past Surgical History:   Procedure Laterality Date    HX CHOLECYSTECTOMY      HX KNEE SURGERY Right     3 arthroscopies    HX LAP CHOLECYSTECTOMY      HX TONSILLECTOMY      HX TYMPANOSTOMY      HX UPPER ENDOSCOPY      SLEEVE GASTROPLASTY         Family Medical History:       Problem Relation (Age of Onset)    Diabetes Father, Paternal Grandfather, Paternal 50, General Family Hx, Other    Digestive problems Mother    Healthy Brother    Obesity Mother    Other Mother, Father          Social History     Socioeconomic History    Marital status: Single   Tobacco Use    Smoking status: Never    Smokeless tobacco: Never   Vaping Use    Vaping Use: Never used   Substance and Sexual Activity    Alcohol use: Not Currently     Comment: rare    Drug use: Never      ALLERGIES  Allergies   Allergen Reactions    Latex Rash    Dexamethasone Acetate Diarrhea, Nausea/ Vomiting and Hives/ Urticaria    Dexamethasone Sodium Phosphate Rash, Diarrhea and Nausea/ Vomiting       PHYSICAL EXAM  VITAL SIGNS:  Filed Vitals:    09/12/22 0128   BP: (!) 147/110   Pulse: 76   Resp: 18   Temp: 36.2 C (97.1 F)   SpO2: 98%     GENERAL: PATIENT IS ALERT AND ORIENTED TO PERSON, PLACE, AND TIME.  HEAD: NORMOCEPHALIC AND ATRAUMATIC.  EYES: PUPILS EQUALLY ROUND AND REACT TO LIGHT. EXTRAOCULAR MOVEMENTS INTACT.  EARS: GROSS HEARING INTACT. EXTERNAL  EARS WITHIN NORMAL LIMITS.  NOSE: NO SEPTAL DEVIATION. NASAL PASSAGES CLEAR.  THROAT: MOIST ORAL MUCOSA. NO ERYTHEMA OR EXUDATE OF THE PHARYNX.  NECK: SUPPLE. TRACHEA MIDLINE.  CARDIOVASCULAR: REGULAR RATE, AND RHYTHM. NO MURMUR.  LUNGS: CLEAR TO AUSCULTATION BILATERAL.  ABDOMEN: SOFT, NON-TENDER, NON-DISTENDED, AND BOWEL SOUNDS ARE PRESENT.  GENITOURINARY: DEFERRED.  RECTAL: DEFERRED.  EXTREMITIES: NO CYANOSIS, CLUBBING, OR EDEMA.  SKIN: WARM AND DRY.  NEUROLOGIC: CRANIAL NERVES II THROUGH XII ARE GROSSLY INTACT. MOVES ALL 4 EXTREMITIES.  PSYCHIATRIC: JUDGMENT AND INSIGHT ARE SEEMINGLY INTACT. MOOD AND AFFECT ARE APPROPRIATE FOR THE SITUATION.    DIAGNOSTICS  Labs:  Labs listed below were reviewed and interpreted by me.  Results for orders placed or performed during the hospital encounter of 09/12/22   COMPREHENSIVE METABOLIC PANEL, NON-FASTING   Result Value Ref Range    SODIUM 140 136 - 145 mmol/L    POTASSIUM 3.6 3.5 -  5.1 mmol/L    CHLORIDE 107 98 - 107 mmol/L    CO2 TOTAL 26 21 - 31 mmol/L    ANION GAP 7 4 - 13 mmol/L    BUN 13 7 - 25 mg/dL    CREATININE 0.77 0.60 - 1.30 mg/dL    BUN/CREA RATIO 17 6 - 22    ESTIMATED GFR 110 >59 mL/min/1.73m^2    ALBUMIN 4.1 3.5 - 5.7 g/dL    CALCIUM 9.5 8.6 - 10.3 mg/dL    GLUCOSE 96 74 - 109 mg/dL    ALKALINE PHOSPHATASE 69 34 - 104 U/L    ALT (SGPT) 20 7 - 52 U/L    AST (SGOT) 19 13 - 39 U/L    BILIRUBIN TOTAL 0.3 0.3 - 1.2 mg/dL    PROTEIN TOTAL 7.5 6.4 - 8.9 g/dL    ALBUMIN/GLOBULIN RATIO 1.2 0.8 - 1.4    OSMOLALITY, CALCULATED 279 270 - 290 mOsm/kg    CALCIUM, CORRECTED 9.4 8.9 - 10.8 mg/dL    GLOBULIN 3.4 2.9 - 5.4   C-REACTIVE PROTEIN (CRP)   Result Value Ref Range    C-REACTIVE PROTEIN (CRP) 0.8 (H) 0.1 - 0.5 mg/dL   LACTIC ACID LEVEL W/ REFLEX FOR LEVEL >2.0   Result Value Ref Range    LACTIC ACID 0.6 0.5 - 2.2 mmol/L   CBC WITH DIFF   Result Value Ref Range    WBC 11.2 3.8 - 11.8 x10^3/uL    RBC 4.44 3.63 - 4.92 x10^6/uL    HGB 12.7 10.9 - 14.3 g/dL    HCT 38.0 31.2  - 41.9 %    MCV 85.4 75.5 - 95.3 fL    MCH 28.7 24.7 - 32.8 pg    MCHC 33.6 32.3 - 35.6 g/dL    RDW 13.8 12.3 - 17.7 %    PLATELETS 318 140 - 440 x10^3/uL    MPV 8.0 7.9 - 10.8 fL    NEUTROPHIL % 66 43 - 77 %    LYMPHOCYTE % 28 16 - 44 %    MONOCYTE % 5 5 - 13 %    EOSINOPHIL % 1 %    BASOPHIL % 0 0 - 1 %    NEUTROPHIL # 7.40 1.85 - 7.80 x10^3/uL    LYMPHOCYTE # 3.10 (H) 1.00 - 3.00 x10^3/uL    MONOCYTE # 0.60 0.30 - 1.00 x10^3/uL    EOSINOPHIL # 0.10 0.00 - 0.50 x10^3/uL    BASOPHIL # 0.00 0.00 - 0.10 x10^3/uL   COVID-19, FLU A/B, RSV RAPID BY PCR   Result Value Ref Range    SARS-CoV-2 Not Detected Not Detected    INFLUENZA VIRUS TYPE A Not Detected Not Detected    INFLUENZA VIRUS TYPE B Not Detected Not Detected    RESPIRATORY SYNCTIAL VIRUS (RSV) Not Detected Not Detected   ECG 12 LEAD   Result Value Ref Range    Ventricular rate 68 BPM    Atrial Rate 68 BPM    PR Interval 196 ms    QRS Duration 74 ms    QT Interval 402 ms    QTC Calculation 427 ms    Calculated P Axis 47 degrees    Calculated R Axis 70 degrees    Calculated T Axis 29 degrees     Radiology:  Results for orders placed or performed during the hospital encounter of 09/12/22   XR CHEST PA AND LATERAL     Status: None    Narrative  [facility] PAIGE Brasel    RADIOLOGIST: Truman Hayward, MD    XR CHEST PA AND LATERAL performed on 09/12/2022 1:24 AM    CLINICAL HISTORY: CHEST PAIN.      left sided chest pain, that hurts into left shoulders and between scapulas.    TECHNIQUE: Frontal and lateral views of the chest.    COMPARISON:  04/04/2022    FINDINGS:    The heart size is normal.  The mediastinal contour is unremarkable.    Right perihilar opacity may represent atelectasis or pneumonia.   The lungs are otherwise clear. No pleural effusion.        Impression    Hazy opacification along the right heart border may represent atelectasis or pneumonia.      Radiologist location ID: [id number]         ED COURSE/MEDICAL DECISION MAKING           Medical Decision Making  The patient presents to the ED with chest pain and shortness of breath.  Symptoms began earlier tonight.  The patient was originally evaluated by the provider in triage.  Labs were obtained and chest x-ray was performed.  The patient's labs were unremarkable.  X-ray shows atelectasis versus early infiltrate in the right perihilar region.  Does not have any symptoms, vital signs or physical exam findings that are concerning for PE at this time.  The patient will be discharged home with doxycycline.        CLINICAL IMPRESSION  Clinical Impression   Pneumonia of right lung due to infectious organism, unspecified part of lung (Primary)     DISPOSITION  Discharged       DISCHARGE MEDICATIONS  Current Discharge Medication List        START taking these medications    Details   doxycycline hyclate (VIBRAMYCIN) 100 mg Oral Capsule Take 1 Capsule (100 mg total) by mouth Twice daily for 10 days  Qty: 20 Capsule, Refills: 0             Madelaine Bhat Hyacinth Meeker D.O.   09/12/2022, 05:39   [facility]  Department of Emergency Medicine  [facility]    Contents of the document, in whole or in part, are completed utilizing M*Modal dictation technology, please forgive any typographical errors that may exist.   -----

## 2022-09-12 NOTE — ED Nurses Note (Addendum)
Patient's BP is 147/79.  Dede Query NP notified of BP.  States to hold hydralazine for now.

## 2022-09-12 NOTE — Discharge Instructions (Signed)
Doxycycline 100 mg every 12 hours times 10 days.      Return to the emergency department if worse and as needed.    Follow up with the primary care provider

## 2022-09-12 NOTE — ED Triage Notes (Signed)
Chest pain, pain between shoulder blades since 1900, shortness of breath for last hour.

## 2022-09-12 NOTE — Discharge Instructions (Signed)
Continue doxycycline. Return for any concern. Use incentive every 1 to 2 hours .

## 2022-09-12 NOTE — ED Triage Notes (Signed)
Pain left upper chest through to back since yest evening at 7 pm and shortness of breath since yest evening at 11 pm. Pt reports was seen at [facility] ER last night and diagnosed with pneumonia. Pt was given Rx Doxycycline 100 mg po BID

## 2022-09-12 NOTE — ED Provider Notes (Signed)
[facility], [facility] Emergency Department  ED Primary Provider Note  History of Present Illness   Chief Complaint   Patient presents with    Shortness of Breath     Arrival: The patient arrived by Ambulance  Sabrina Morgan is a 27 y.o. female who had concerns including Shortness of Breath. Cp sob since yesterday. Seen pch dx pneumonia got rx doxy states feeling worse more pain more sob. Denies hx of clots nor cad. No hx of asthma.     Review of Systems   Constitutional: No fever, chills or weakness   Skin: No rash or diaphoresis  HENT: No headaches, or congestion  Eyes: No vision changes or photophobia   Cardio: + chest pain,no  palpitations or leg swelling   Respiratory: + cough, wheezing  SOB  GI:  No nausea, vomiting or stool changes  GU:  No dysuria, hematuria, or increased frequency  MSK: No muscle aches, joint or back pain  Neuro: No seizures, LOC, numbness, tingling, or focal weakness  Psychiatric: No depression, SI or substance abuse  All other systems reviewed and are negative.    Historical Data   History Reviewed This Encounter:all noted and reviewed    Physical Exam   ED Triage Vitals [09/12/22 1157]   BP (Non-Invasive) (!) 153/104   Heart Rate 87   Respiratory Rate 20   Temperature 36.7 C (98.1 F)   SpO2 98 %   Weight 134 kg (295 lb)   Height 1.829 m (6')       Constitutional:  27 y.o. female who appears in no distress. Normal color, no cyanosis.   HENT:   Head: Normocephalic and atraumatic.   Mouth/Throat: Oropharynx is clear and moist.   Eyes: EOMI, PERRL   Neck: Trachea midline. Neck supple.  Cardiovascular: RRR, No murmurs, rubs or gallops. Intact distal pulses.  Pulmonary/Chest: diminished rt base. No respiratory distress. No wheezes, rales or chest tenderness.   Abdominal: Bowel sounds present and normal. Abdomen soft, no tenderness, no rebound and no guarding.  Back: No midline spinal tenderness, no paraspinal tenderness, no CVA tenderness.            Musculoskeletal: No edema, tenderness or deformity.  Skin: warm and dry. No rash, erythema, pallor or cyanosis  Psychiatric: normal mood and affect. Behavior is normal.   Neurological: Patient keenly alert and responsive, easily able to raise eyebrows, facial muscles/expressions symmetric, speaking in fluent sentences, moving all extremities equally and fully, normal gait  Patient Data     Labs Ordered/Reviewed   COMPREHENSIVE METABOLIC PANEL, NON-FASTING - Abnormal; Notable for the following components:       Result Value    POTASSIUM 3.4 (*)     PROTEIN TOTAL 8.4 (*)     All other components within normal limits    Narrative:     Estimated Glomerular Filtration Rate (eGFR) is calculated using the CKD-EPI (2021) equation, intended for patients 30 years of age and older. If gender is not documented or "unknown", there will be no eGFR calculation.   CBC WITH DIFF - Abnormal; Notable for the following components:    RDW 15.2 (*)     LYMPHOCYTE % 22 (*)     All other components within normal limits   HCG QUALITATIVE PREGNANCY, SERUM - Normal   D-DIMER - Normal   TROPONIN-I - Normal    Narrative:     Values received on females ranging between 12-15 ng/L MUST include the next serial  troponin to review changes in the delta differences as the reference range for the Access II chemistry analyzer is lower than the established reference range.     LACTIC ACID LEVEL W/ REFLEX FOR LEVEL >2.0 - Normal   ADULT ROUTINE BLOOD CULTURE, SET OF 2 BOTTLES (BACTERIA AND YEAST)   ADULT ROUTINE BLOOD CULTURE, SET OF 2 BOTTLES (BACTERIA AND YEAST)   CBC/DIFF    Narrative:     The following orders were created for panel order CBC/DIFF.  Procedure                               Abnormality         Status                     ---------                               -----------         ------                     CBC WITH YBWL[[id number]]                Abnormal            Final result                 Please view results for these tests on the  individual orders.     CT CHEST WO IV CONTRAST   Final Result by Edi, Radresults In (01/23 1514)   NO ACUTE CARDIOPULMONARY PROCESS IDENTIFIED.         One or more dose reduction techniques were used (e.g., Automated exposure control, adjustment of the mA and/or kV according to patient size, use of iterative reconstruction technique).         Radiologist location ID: [id number]         XR CHEST AP   Final Result by Edi, Radresults In (01/23 1219)   NO ACUTE FINDINGS.         Radiologist location ID: [place] Decision Making   Diff dx pneumonia covid rsv flu pe.       Medications Administered in the ED   NS flush syringe (has no administration in time range)   NS flush syringe (has no administration in time range)   hydrALAZINE (APRESOLINE) injection 5 mg (has no administration in time range)   ketorolac (TORADOL) 30 mg/mL injection (30 mg Intravenous Given 09/12/22 1251)   cefTRIAXone (ROCEPHIN) 1 g in NS 50 mL IVPB minibag (0 g Intravenous Stopped 09/12/22 1337)   methylPREDNISolone sod succ (SOLU-medrol) 125 mg/2 mL injection (125 mg Intravenous Given 09/12/22 1254)   NS bolus infusion 1,000 mL (0 mL Intravenous Stopped 09/12/22 1350)   ipratropium-albuterol 0.5 mg-3 mg(2.5 mg base)/3 mL Solution for Nebulization (3 mL Nebulization Given 09/12/22 1207)   potassium bicarbonate-citric acid (EFFER-K) effervescent tablet (25 mEq Oral Given 09/12/22 1355)   methocarbamol (ROBAXIN) tablet (1,500 mg Oral Given 09/12/22 1426)     Clinical Impression   Pleurisy (Primary)   Chest pain, unspecified type       Disposition: Discharged

## 2022-09-15 LAB — ADULT ROUTINE BLOOD CULTURE, SET OF 2 BOTTLES (BACTERIA AND YEAST): BLOOD CULTURE, ROUTINE: NO GROWTH

## 2022-09-16 LAB — ADULT ROUTINE BLOOD CULTURE, SET OF 2 BOTTLES (BACTERIA AND YEAST): BLOOD CULTURE, ROUTINE: NO GROWTH

## 2022-09-17 LAB — ADULT ROUTINE BLOOD CULTURE, SET OF 2 BOTTLES (BACTERIA AND YEAST)
BLOOD CULTURE, ROUTINE: NO GROWTH
BLOOD CULTURE, ROUTINE: NO GROWTH

## 2022-09-20 ENCOUNTER — Other Ambulatory Visit (HOSPITAL_BASED_OUTPATIENT_CLINIC_OR_DEPARTMENT_OTHER): Payer: Self-pay | Admitting: NURSE PRACTITIONER

## 2022-09-20 ENCOUNTER — Inpatient Hospital Stay
Admission: RE | Admit: 2022-09-20 | Discharge: 2022-09-20 | Disposition: A | Discharge status: Home / Self Care | Payer: BC Managed Care – PPO | Source: Ambulatory Visit | Attending: NURSE PRACTITIONER | Admitting: NURSE PRACTITIONER

## 2022-09-20 ENCOUNTER — Other Ambulatory Visit: Payer: Self-pay

## 2022-09-20 DIAGNOSIS — R079 Chest pain, unspecified: Secondary | ICD-10-CM | POA: Insufficient documentation

## 2022-09-20 DIAGNOSIS — R0781 Pleurodynia: Secondary | ICD-10-CM

## 2022-12-26 ENCOUNTER — Other Ambulatory Visit: Payer: Self-pay

## 2022-12-26 ENCOUNTER — Encounter (HOSPITAL_BASED_OUTPATIENT_CLINIC_OR_DEPARTMENT_OTHER): Payer: Self-pay

## 2022-12-26 ENCOUNTER — Emergency Department
Admission: EM | Admit: 2022-12-26 | Discharge: 2022-12-26 | Disposition: A | Discharge status: Home / Self Care | Payer: BC Managed Care – PPO | Attending: Family | Admitting: Family

## 2022-12-26 ENCOUNTER — Emergency Department (HOSPITAL_BASED_OUTPATIENT_CLINIC_OR_DEPARTMENT_OTHER): Payer: BC Managed Care – PPO

## 2022-12-26 DIAGNOSIS — Z3200 Encounter for pregnancy test, result unknown: Secondary | ICD-10-CM | POA: Insufficient documentation

## 2022-12-26 DIAGNOSIS — N390 Urinary tract infection, site not specified: Secondary | ICD-10-CM | POA: Insufficient documentation

## 2022-12-26 DIAGNOSIS — R109 Unspecified abdominal pain: Secondary | ICD-10-CM | POA: Insufficient documentation

## 2022-12-26 LAB — URINALYSIS, MACRO/MICRO
BILIRUBIN: NEGATIVE mg/dL
GLUCOSE: NEGATIVE mg/dL
KETONES: NEGATIVE mg/dL
NITRITE: NEGATIVE
PH: 6 (ref 4.6–8.0)
PROTEIN: 30 mg/dL — AB
SPECIFIC GRAVITY: 1.025 (ref 1.003–1.035)
UROBILINOGEN: 0.2 mg/dL (ref 0.2–1.0)

## 2022-12-26 LAB — COMPREHENSIVE METABOLIC PANEL, NON-FASTING
ALBUMIN/GLOBULIN RATIO: 1 (ref 0.8–1.4)
ALBUMIN: 3.8 g/dL (ref 3.4–5.0)
ALKALINE PHOSPHATASE: 83 U/L (ref 46–116)
ALT (SGPT): 21 U/L (ref <=78)
ANION GAP: 9 mmol/L (ref 4–13)
AST (SGOT): 18 U/L (ref 15–37)
BILIRUBIN TOTAL: 0.4 mg/dL (ref 0.2–1.0)
BUN/CREA RATIO: 13
BUN: 11 mg/dL (ref 7–18)
CALCIUM, CORRECTED: 9.4 mg/dL
CALCIUM: 9.2 mg/dL (ref 8.5–10.1)
CHLORIDE: 106 mmol/L (ref 98–107)
CO2 TOTAL: 26 mmol/L (ref 21–32)
CREATININE: 0.84 mg/dL (ref 0.55–1.02)
ESTIMATED GFR: 99 mL/min/{1.73_m2} (ref >59)
GLOBULIN: 4
GLUCOSE: 85 mg/dL (ref 74–106)
OSMOLALITY, CALCULATED: 280 mOsm/kg (ref 270–290)
POTASSIUM: 4.3 mmol/L (ref 3.5–5.1)
PROTEIN TOTAL: 7.8 g/dL (ref 6.4–8.2)
SODIUM: 141 mmol/L (ref 136–145)

## 2022-12-26 LAB — URINALYSIS, MICROSCOPIC

## 2022-12-26 LAB — CBC WITH DIFF
BASOPHIL #: 0.02 10*3/uL (ref 0.00–0.30)
BASOPHIL %: 0 % (ref 0–3)
EOSINOPHIL #: 0.05 10*3/uL (ref 0.00–0.80)
EOSINOPHIL %: 1 % (ref 0–7)
HCT: 42.5 % (ref 37.0–47.0)
HGB: 13.7 g/dL (ref 12.5–16.0)
LYMPHOCYTE #: 1.67 10*3/uL (ref 1.10–5.00)
LYMPHOCYTE %: 25 % (ref 25–45)
MCH: 28.2 pg (ref 27.0–32.0)
MCHC: 32.3 g/dL (ref 32.0–36.0)
MCV: 87.3 fL (ref 78.0–99.0)
MONOCYTE #: 0.49 10*3/uL (ref 0.00–1.30)
MONOCYTE %: 7 % (ref 0–12)
MPV: 7.4 fL (ref 7.4–10.4)
NEUTROPHIL #: 4.31 10*3/uL (ref 1.80–8.40)
NEUTROPHIL %: 66 % (ref 40–76)
PLATELETS: 310 10*3/uL (ref 140–440)
RBC: 4.87 10*6/uL (ref 4.20–5.40)
RDW: 15 % — ABNORMAL HIGH (ref 11.6–14.8)
WBC: 6.5 10*3/uL (ref 4.0–10.5)

## 2022-12-26 LAB — HCG QUALITATIVE PREGNANCY, SERUM: PREGNANCY, SERUM QUALITATIVE: NEGATIVE

## 2022-12-26 MED ORDER — SODIUM CHLORIDE 0.9 % (FLUSH) INJECTION SYRINGE
3.0000 mL | INJECTION | INTRAMUSCULAR | Status: DC | PRN
Start: 2022-12-26 — End: 2022-12-26

## 2022-12-26 MED ORDER — SODIUM CHLORIDE 0.9 % INTRAVENOUS PIGGYBACK
2.0000 g | INTRAVENOUS | Status: AC
Start: 2022-12-26 — End: 2022-12-26
  Administered 2022-12-26: 0 g via INTRAVENOUS
  Administered 2022-12-26: 2 g via INTRAVENOUS

## 2022-12-26 MED ORDER — KETOROLAC 10 MG TABLET
10.0000 mg | ORAL_TABLET | Freq: Four times a day (QID) | ORAL | 0 refills | Status: DC | PRN
Start: 2022-12-26 — End: 2023-11-30

## 2022-12-26 MED ORDER — TRAMADOL 50 MG TABLET
100.0000 mg | ORAL_TABLET | ORAL | Status: AC
Start: 2022-12-26 — End: 2022-12-26
  Administered 2022-12-26: 100 mg via ORAL

## 2022-12-26 MED ORDER — SODIUM CHLORIDE 0.9 % (FLUSH) INJECTION SYRINGE
3.0000 mL | INJECTION | Freq: Three times a day (TID) | INTRAMUSCULAR | Status: DC
Start: 2022-12-26 — End: 2022-12-26

## 2022-12-26 MED ORDER — SODIUM CHLORIDE 0.9 % IV BOLUS
1000.0000 mL | INJECTION | Status: AC
Start: 2022-12-26 — End: 2022-12-26
  Administered 2022-12-26: 1000 mL via INTRAVENOUS
  Administered 2022-12-26: 0 mL via INTRAVENOUS

## 2022-12-26 MED ORDER — CEFTRIAXONE 2 GRAM SOLUTION FOR INJECTION
INTRAMUSCULAR | Status: AC
Start: 2022-12-26 — End: 2022-12-26
  Filled 2022-12-26: qty 20

## 2022-12-26 MED ORDER — SODIUM CHLORIDE 0.9 % INTRAVENOUS PIGGYBACK
INJECTION | INTRAVENOUS | Status: AC
Start: 2022-12-26 — End: 2022-12-26
  Filled 2022-12-26: qty 50

## 2022-12-26 MED ORDER — ONDANSETRON HCL (PF) 4 MG/2 ML INJECTION SOLUTION
4.0000 mg | INTRAMUSCULAR | Status: AC
Start: 2022-12-26 — End: 2022-12-26
  Administered 2022-12-26: 4 mg via INTRAVENOUS

## 2022-12-26 MED ORDER — ONDANSETRON 4 MG DISINTEGRATING TABLET
4.0000 mg | ORAL_TABLET | Freq: Three times a day (TID) | ORAL | 0 refills | Status: DC | PRN
Start: 2022-12-26 — End: 2023-11-30

## 2022-12-26 MED ORDER — NITROFURANTOIN MONOHYDRATE/MACROCRYSTALS 100 MG CAPSULE
100.0000 mg | ORAL_CAPSULE | Freq: Two times a day (BID) | ORAL | 0 refills | Status: AC
Start: 2022-12-26 — End: 2023-01-02

## 2022-12-26 MED ORDER — SODIUM CHLORIDE 0.9 % IV BOLUS
1000.0000 mL | INJECTION | Status: AC
Start: 2022-12-26 — End: 2022-12-26
  Administered 2022-12-26: 0 mL via INTRAVENOUS
  Administered 2022-12-26: 1000 mL via INTRAVENOUS

## 2022-12-26 MED ORDER — TRAMADOL 50 MG TABLET
ORAL_TABLET | ORAL | Status: AC
Start: 2022-12-26 — End: 2022-12-26
  Filled 2022-12-26: qty 2

## 2022-12-26 MED ORDER — KETOROLAC 30 MG/ML (1 ML) INJECTION SOLUTION
INTRAMUSCULAR | Status: AC
Start: 2022-12-26 — End: 2022-12-26
  Filled 2022-12-26: qty 1

## 2022-12-26 MED ORDER — ONDANSETRON HCL (PF) 4 MG/2 ML INJECTION SOLUTION
INTRAMUSCULAR | Status: AC
Start: 2022-12-26 — End: 2022-12-26
  Filled 2022-12-26: qty 2

## 2022-12-26 MED ORDER — KETOROLAC 30 MG/ML (1 ML) INJECTION SOLUTION
30.0000 mg | INTRAMUSCULAR | Status: AC
Start: 2022-12-26 — End: 2022-12-26
  Administered 2022-12-26: 30 mg via INTRAVENOUS

## 2022-12-26 MED ORDER — TRAMADOL 37.5 MG-ACETAMINOPHEN 325 MG TABLET
1.0000 | ORAL_TABLET | Freq: Four times a day (QID) | ORAL | 0 refills | Status: DC | PRN
Start: 2022-12-26 — End: 2023-11-30

## 2022-12-26 NOTE — ED Nurses Note (Signed)
Pt rates her back pain as 4-5. Left ambulatory with her brother, verbalized understanding of instructions. Saline lock removed and dressing applied.

## 2022-12-26 NOTE — ED Nurses Note (Signed)
Patient c/o dysuria and low left back x 2 days. Denies fever, NVD.

## 2022-12-26 NOTE — ED Triage Notes (Signed)
Left flank pain since 1200 yesterday. Decreased urinary output. N/V .

## 2022-12-26 NOTE — ED Nurses Note (Signed)
Patient stated pain "has subsided very little." Rated pain 6/10. NP notified. Orders pending.

## 2022-12-26 NOTE — ED Nurses Note (Signed)
Patient resting in bed with eyes closed and lights turned down. Resp even and unlabored on RA.

## 2022-12-26 NOTE — ED Provider Notes (Signed)
[facility], [facility] Emergency Department  ED Primary Provider Note  History of Present Illness   Chief Complaint   Patient presents with    Urinary Frequency    Back Pain     Arrival: The patient arrived by Car  Sabrina Morgan is a 27 y.o. female who had concerns including Urinary Frequency and Back Pain. Pt states severe left flank pain since last pm, decreased urine out put .denies hx of stones.   Review of Systems   Constitutional: No fever, chills or weakness   Skin: No rash or diaphoresis  HENT: No headaches, or congestion  Eyes: No vision changes or photophobia   Cardio: No chest pain, palpitations or leg swelling   Respiratory: No cough, wheezing or SOB  GI:  No nausea, vomiting or stool changes  GU:  + left flank pain, dysuria,  increased frequency  MSK: No muscle aches, joint or back pain  Neuro: No seizures, LOC, numbness, tingling, or focal weakness  Psychiatric: No depression, SI or substance abuse  All other systems reviewed and are negative.   History Reviewed This Encounter: all note and reviewed  Physical Exam   ED Triage Vitals [12/26/22 1156]   BP (Non-Invasive) (!) 149/102   Heart Rate 77   Respiratory Rate 18   Temperature 36.6 C (97.9 F)   SpO2 98 %   Weight 129 kg (285 lb)   Height        Constitutional:  27 y.o. female who appears in no distress. Normal color, no cyanosis.   HENT:   Head: Normocephalic and atraumatic.   Mouth/Throat: Oropharynx is clear and moist.   Eyes: EOMI, PERRL   Neck: Trachea midline. Neck supple.  Cardiovascular: RRR, No murmurs, rubs or gallops. Intact distal pulses.  Pulmonary/Chest: BS equal bilaterally. No respiratory distress. No wheezes, rales or chest tenderness.   Abdominal: Bowel sounds present and normal. Abdomen soft, no tenderness, no rebound and no guarding.  Back: No midline spinal tenderness, no paraspinal tenderness, + left CVA tenderness.           Musculoskeletal: No edema, tenderness or deformity.  Skin: warm  and dry. No rash, erythema, pallor or cyanosis  Psychiatric: normal mood and affect. Behavior is normal.   Neurological: Patient keenly alert and responsive, easily able to raise eyebrows, facial muscles/expressions symmetric, speaking in fluent sentences, moving all extremities equally and fully, normal gait  Patient Data     Labs Ordered/Reviewed   CBC WITH DIFF - Abnormal; Notable for the following components:       Result Value    RDW 15.0 (*)     All other components within normal limits   URINALYSIS, MACRO/MICRO - Abnormal; Notable for the following components:    APPEARANCE Slightly Cloudy (*)     LEUKOCYTES Moderate (*)     PROTEIN 30 (*)     BLOOD Large (*)     All other components within normal limits   URINALYSIS, MICROSCOPIC - Abnormal; Notable for the following components:    BACTERIA Few (*)     MUCOUS Few (*)     WBCS 16-20 (*)     All other components within normal limits   HCG QUALITATIVE PREGNANCY, SERUM - Normal   URINE CULTURE,ROUTINE   CBC/DIFF    Narrative:     The following orders were created for panel order CBC/DIFF.  Procedure  Abnormality         Status                     ---------                               -----------         ------                     CBC WITH ZOXW[[id number]]                Abnormal            Final result                 Please view results for these tests on the individual orders.   COMPREHENSIVE METABOLIC PANEL, NON-FASTING    Narrative:     Estimated Glomerular Filtration Rate (eGFR) is calculated using the CKD-EPI (2021) equation, intended for patients 46 years of age and older. If gender is not documented or "unknown", there will be no eGFR calculation.   URINALYSIS WITH REFLEX MICROSCOPIC AND CULTURE IF POSITIVE    Narrative:     The following orders were created for panel order URINALYSIS WITH REFLEX MICROSCOPIC AND CULTURE IF POSITIVE.  Procedure                               Abnormality         Status                      ---------                               -----------         ------                     URINALYSIS, MACRO/MICRO[[id number]]      Abnormal            Final result               URINALYSIS, MICROSCOPIC[[id number]]      Abnormal            Final result                 Please view results for these tests on the individual orders.     CT ABDOMEN PELVIS WO IV CONTRAST   Final Result by Edi, Radresults In (05/07 1327)   NO ACUTE FINDINGS AT THE ABDOMEN OR PELVIS ON NONCONTRAST CT.          One or more dose reduction techniques were used (e.g., Automated exposure control, adjustment of the mA and/or kV according to patient size, use of iterative reconstruction technique).         Radiologist location ID: [id number]           Medical Decision Making   Diff dx of stone UTI pyelo pcos stone .  Sx improved post ultram.   Ct no stones.   Medications Administered in the ED   ketorolac (TORADOL) 30 mg/mL injection (30 mg Intravenous Given 12/26/22 1241)   NS bolus infusion 1,000 mL (0 mL Intravenous Stopped 12/26/22 1341)   cefTRIAXone (ROCEPHIN) 2 g in NS 50 mL IVPB minibag (0 g Intravenous  Stopped 12/26/22 1356)   NS bolus infusion 1,000 mL (0 mL Intravenous Stopped 12/26/22 1430)   traMADol (ULTRAM) tablet (100 mg Oral Given 12/26/22 1326)   ondansetron (ZOFRAN) 2 mg/mL injection (4 mg Intravenous Given 12/26/22 1332)   DX UTI, ACUTE LEFT FLANK PAIN.   discharged

## 2022-12-28 LAB — URINE CULTURE,ROUTINE: URINE CULTURE: 50000 — AB

## 2023-04-02 ENCOUNTER — Emergency Department (HOSPITAL_BASED_OUTPATIENT_CLINIC_OR_DEPARTMENT_OTHER): Payer: BC Managed Care – PPO

## 2023-04-02 ENCOUNTER — Emergency Department
Admission: EM | Admit: 2023-04-02 | Discharge: 2023-04-02 | Disposition: A | Discharge status: Home / Self Care | Payer: BC Managed Care – PPO | Attending: Family | Admitting: Family

## 2023-04-02 ENCOUNTER — Encounter (HOSPITAL_BASED_OUTPATIENT_CLINIC_OR_DEPARTMENT_OTHER): Payer: Self-pay

## 2023-04-02 ENCOUNTER — Other Ambulatory Visit: Payer: Self-pay

## 2023-04-02 DIAGNOSIS — R112 Nausea with vomiting, unspecified: Secondary | ICD-10-CM | POA: Insufficient documentation

## 2023-04-02 DIAGNOSIS — Z3200 Encounter for pregnancy test, result unknown: Secondary | ICD-10-CM | POA: Insufficient documentation

## 2023-04-02 LAB — COMPREHENSIVE METABOLIC PANEL, NON-FASTING
ALBUMIN/GLOBULIN RATIO: 0.9 (ref 0.8–1.4)
ALBUMIN: 3.9 g/dL (ref 3.4–5.0)
ALKALINE PHOSPHATASE: 89 U/L (ref 46–116)
ALT (SGPT): 20 U/L (ref <=78)
ANION GAP: 9 mmol/L (ref 4–13)
AST (SGOT): 12 U/L — ABNORMAL LOW (ref 15–37)
BILIRUBIN TOTAL: 0.4 mg/dL (ref 0.2–1.0)
BUN/CREA RATIO: 18
BUN: 15 mg/dL (ref 7–18)
CALCIUM, CORRECTED: 9.4 mg/dL
CALCIUM: 9.3 mg/dL (ref 8.5–10.1)
CHLORIDE: 105 mmol/L (ref 98–107)
CO2 TOTAL: 27 mmol/L (ref 21–32)
CREATININE: 0.83 mg/dL (ref 0.55–1.02)
ESTIMATED GFR: 101 mL/min/{1.73_m2} (ref >59)
GLOBULIN: 4.5
GLUCOSE: 85 mg/dL (ref 74–106)
OSMOLALITY, CALCULATED: 281 mOsm/kg (ref 270–290)
POTASSIUM: 3.9 mmol/L (ref 3.5–5.1)
PROTEIN TOTAL: 8.4 g/dL — ABNORMAL HIGH (ref 6.4–8.2)
SODIUM: 141 mmol/L (ref 136–145)

## 2023-04-02 LAB — CBC WITH DIFF
BASOPHIL #: 0.05 10*3/uL (ref 0.00–0.30)
BASOPHIL %: 1 % (ref 0–3)
EOSINOPHIL #: 0.06 10*3/uL (ref 0.00–0.80)
EOSINOPHIL %: 1 % (ref 1–7)
HCT: 39.3 % (ref 37.0–47.0)
HGB: 13.5 g/dL (ref 12.5–16.0)
LYMPHOCYTE #: 2.44 10*3/uL (ref 1.10–5.00)
LYMPHOCYTE %: 28 % (ref 25–45)
MCH: 30.1 pg (ref 27.0–32.0)
MCHC: 34.3 g/dL (ref 32.0–36.0)
MCV: 87.8 fL (ref 78.0–99.0)
MONOCYTE #: 0.53 10*3/uL (ref 0.00–1.30)
MONOCYTE %: 6 % (ref 0–12)
MPV: 7.6 fL (ref 7.4–10.4)
NEUTROPHIL #: 5.75 10*3/uL (ref 1.80–8.40)
NEUTROPHIL %: 65 % (ref 40–76)
PLATELETS: 322 10*3/uL (ref 140–440)
RBC: 4.48 10*6/uL (ref 4.20–5.40)
RDW: 15.3 % — ABNORMAL HIGH (ref 11.6–14.8)
WBC: 8.8 10*3/uL (ref 4.0–10.5)

## 2023-04-02 LAB — LIPASE: LIPASE: 22 U/L (ref 16–77)

## 2023-04-02 LAB — HCG QUALITATIVE PREGNANCY, SERUM: PREGNANCY, SERUM QUALITATIVE: NEGATIVE

## 2023-04-02 LAB — MAGNESIUM: MAGNESIUM: 2 mg/dL (ref 1.8–2.4)

## 2023-04-02 MED ORDER — SODIUM CHLORIDE 0.9 % (FLUSH) INJECTION SYRINGE
3.0000 mL | INJECTION | INTRAMUSCULAR | Status: DC | PRN
Start: 2023-04-02 — End: 2023-04-02

## 2023-04-02 MED ORDER — ONDANSETRON 4 MG DISINTEGRATING TABLET
4.0000 mg | ORAL_TABLET | Freq: Three times a day (TID) | ORAL | 0 refills | Status: DC | PRN
Start: 2023-04-02 — End: 2023-11-30

## 2023-04-02 MED ORDER — PANTOPRAZOLE 40 MG INTRAVENOUS SOLUTION
80.0000 mg | INTRAVENOUS | Status: AC
Start: 2023-04-02 — End: 2023-04-02
  Administered 2023-04-02: 80 mg via INTRAVENOUS

## 2023-04-02 MED ORDER — PANTOPRAZOLE 40 MG TABLET,DELAYED RELEASE
40.0000 mg | DELAYED_RELEASE_TABLET | Freq: Every day | ORAL | 0 refills | Status: DC
Start: 2023-04-02 — End: 2023-11-30

## 2023-04-02 MED ORDER — PANTOPRAZOLE 40 MG INTRAVENOUS SOLUTION
INTRAVENOUS | Status: AC
Start: 2023-04-02 — End: 2023-04-02
  Filled 2023-04-02: qty 20

## 2023-04-02 MED ORDER — SODIUM CHLORIDE 0.9 % (FLUSH) INJECTION SYRINGE
3.0000 mL | INJECTION | Freq: Three times a day (TID) | INTRAMUSCULAR | Status: DC
Start: 2023-04-02 — End: 2023-04-02
  Administered 2023-04-02: 0 mL

## 2023-04-02 MED ORDER — KETOROLAC 30 MG/ML (1 ML) INJECTION SOLUTION
30.0000 mg | INTRAMUSCULAR | Status: AC
Start: 2023-04-02 — End: 2023-04-02
  Administered 2023-04-02: 30 mg via INTRAVENOUS

## 2023-04-02 MED ORDER — PROCHLORPERAZINE EDISYLATE 10 MG/2 ML (5 MG/ML) INJECTION SOLUTION
INTRAMUSCULAR | Status: AC
Start: 2023-04-02 — End: 2023-04-02
  Filled 2023-04-02: qty 2

## 2023-04-02 MED ORDER — PROCHLORPERAZINE EDISYLATE 10 MG/2 ML (5 MG/ML) INJECTION SOLUTION
5.0000 mg | INTRAMUSCULAR | Status: AC
Start: 2023-04-02 — End: 2023-04-02
  Administered 2023-04-02: 5 mg via INTRAVENOUS

## 2023-04-02 MED ORDER — KETOROLAC 30 MG/ML (1 ML) INJECTION SOLUTION
INTRAMUSCULAR | Status: AC
Start: 2023-04-02 — End: 2023-04-02
  Filled 2023-04-02: qty 1

## 2023-04-02 MED ORDER — SODIUM CHLORIDE 0.9 % IV BOLUS
1000.0000 mL | INJECTION | Status: AC
Start: 2023-04-02 — End: 2023-04-02
  Administered 2023-04-02: 1000 mL via INTRAVENOUS
  Administered 2023-04-02: 0 mL via INTRAVENOUS

## 2023-04-02 NOTE — ED Provider Notes (Signed)
[facility] Emergency Department  ED Primary Provider Note  History of Present Illness   Chief Complaint   Patient presents with    Vomiting     Arrival: The patient arrived by Car  [name] Tacora Ficco is a 27 y.o. female who had concerns including Vomiting. Pt states nv no diarrhea. Normally has it after starting wegovey  states she is afraid she has bowel obstruction or pancreatis. No prior hx of either. Nv for a week.  Review of Systems   Constitutional: No fever, chills or weakness   Skin: No rash or diaphoresis  HENT: No headaches, or congestion  Eyes: No vision changes or photophobia   Cardio: No chest pain, palpitations or leg swelling   Respiratory: No cough, wheezing or SOB  GI:  + nausea, vomiting  stool changes  GU:  No dysuria, hematuria, or increased frequency  MSK: No muscle aches, joint or back pain  Neuro: No seizures, LOC, numbness, tingling, or focal weakness  Psychiatric: No depression, SI or substance abuse  All other systems reviewed and are negative.    History Reviewed This Encounter: all noted and reviewed.     Physical Exam   ED Triage Vitals [04/02/23 1616]   BP (Non-Invasive) (!) 159/112   Heart Rate 76   Respiratory Rate 18   Temperature 36.5 C (97.7 F)   SpO2 98 %   Weight 120 kg (264 lb)   Height 1.829 m (6')       Constitutional:  27 y.o. female who appears in no distress. Normal color, no cyanosis.   HENT:   Head: Normocephalic and atraumatic.   Mouth/Throat: Oropharynx is clear and dry.   Eyes: EOMI, PERRL   Neck: Trachea midline. Neck supple.  Cardiovascular: RRR, No murmurs, rubs or gallops. Intact distal pulses.  Pulmonary/Chest: BS equal bilaterally. No respiratory distress. No wheezes, rales or chest tenderness.   Abdominal: Bowel sounds present and normal. Abdomen soft, no tenderness, no rebound and no guarding.  Back: No midline spinal tenderness, no paraspinal tenderness, no CVA tenderness.           Musculoskeletal: No edema,  tenderness or deformity.  Skin: warm and dry. No rash, erythema, pallor or cyanosis  Psychiatric: normal mood and affect. Behavior is normal.   Neurological: Patient keenly alert and responsive, easily able to raise eyebrows, facial muscles/expressions symmetric, speaking in fluent sentences, moving all extremities equally and fully, normal gait  Patient Data     Labs Ordered/Reviewed   COMPREHENSIVE METABOLIC PANEL, NON-FASTING - Abnormal; Notable for the following components:       Result Value    AST (SGOT) 12 (*)     PROTEIN TOTAL 8.4 (*)     All other components within normal limits    Narrative:     Estimated Glomerular Filtration Rate (eGFR) is calculated using the CKD-EPI (2021) equation, intended for patients 32 years of age and older. If gender is not documented or "unknown", there will be no eGFR calculation.   CBC WITH DIFF - Abnormal; Notable for the following components:    RDW 15.3 (*)     All other components within normal limits   LIPASE - Normal   MAGNESIUM - Normal   HCG QUALITATIVE PREGNANCY, SERUM - Normal    Narrative:     Lot [id number] 2024-08-27   CBC/DIFF    Narrative:     The following orders were created for panel order CBC/DIFF.  Procedure  Abnormality         Status                     ---------                               -----------         ------                     CBC WITH DIFF[[id number]]                Abnormal            Final result                 Please view results for these tests on the individual orders.     XR KUB AND UPRIGHT ABDOMEN   Final Result by Edi, Radresults In (08/12 1705)   NEGATIVE TWO VIEWS OF THE ABDOMEN.            Radiologist location ID: [id number]           Medical Decision Making   Diff dx of GERD med side effects. Ge. Dehydration. Pancreatitis. Ct was unavailable. Xray neg no concerning lab values. All sx resolved with meds and fluids in the er .pt walked to desk and asked if she could be DC. Abd soft nt.           Medications Administered in the ED   NS bolus infusion 1,000 mL (0 mL Intravenous Stopped 04/02/23 1805)   pantoprazole (PROTONIX) injection (80 mg Intravenous Given 04/02/23 1705)   ketorolac (TORADOL) 30 mg/mL injection (30 mg Intravenous Given 04/02/23 1706)   prochlorperazine (COMPAZINE) 5 mg/mL injection (5 mg Intravenous Given 04/02/23 1705)     Clinical Impression   Nausea and vomiting, unspecified vomiting type (Primary)       Disposition: Discharged

## 2023-04-02 NOTE — ED Triage Notes (Signed)
Patient has been vomiting since Saturday. States she has not been able to eat or drink anything since 2000 last night. Has lost 10 pounds in 1 week. Lethargic, intermittent left upper quadrant pain. Sent from Dr Melrose Nakayama office.

## 2023-04-02 NOTE — ED Nurses Note (Signed)

## 2023-06-26 ENCOUNTER — Other Ambulatory Visit (HOSPITAL_COMMUNITY): Payer: Self-pay | Admitting: NURSE PRACTITIONER

## 2023-06-26 ENCOUNTER — Inpatient Hospital Stay
Admission: RE | Admit: 2023-06-26 | Discharge: 2023-06-26 | Disposition: A | Discharge status: Home / Self Care | Payer: BC Managed Care – PPO | Source: Ambulatory Visit | Attending: NURSE PRACTITIONER | Admitting: NURSE PRACTITIONER

## 2023-06-26 ENCOUNTER — Other Ambulatory Visit: Payer: Self-pay

## 2023-06-26 DIAGNOSIS — M25521 Pain in right elbow: Secondary | ICD-10-CM

## 2023-08-10 ENCOUNTER — Other Ambulatory Visit (HOSPITAL_COMMUNITY): Payer: Self-pay | Admitting: NURSE PRACTITIONER

## 2023-08-10 ENCOUNTER — Other Ambulatory Visit: Payer: Self-pay

## 2023-08-10 ENCOUNTER — Ambulatory Visit
Admission: RE | Admit: 2023-08-10 | Discharge: 2023-08-10 | Disposition: A | Discharge status: Home / Self Care | Payer: BC Managed Care – PPO | Source: Ambulatory Visit | Attending: NURSE PRACTITIONER | Admitting: NURSE PRACTITIONER

## 2023-08-10 DIAGNOSIS — M545 Low back pain, unspecified: Secondary | ICD-10-CM | POA: Insufficient documentation

## 2023-08-10 DIAGNOSIS — M25551 Pain in right hip: Secondary | ICD-10-CM

## 2023-09-24 ENCOUNTER — Emergency Department (HOSPITAL_COMMUNITY): Payer: BC Managed Care – PPO

## 2023-09-24 ENCOUNTER — Emergency Department
Admission: EM | Admit: 2023-09-24 | Discharge: 2023-09-24 | Disposition: A | Discharge status: Home / Self Care | Payer: BC Managed Care – PPO | Attending: Emergency Medicine | Admitting: Emergency Medicine

## 2023-09-24 ENCOUNTER — Other Ambulatory Visit: Payer: Self-pay

## 2023-09-24 ENCOUNTER — Encounter (HOSPITAL_COMMUNITY): Payer: Self-pay

## 2023-09-24 DIAGNOSIS — R112 Nausea with vomiting, unspecified: Secondary | ICD-10-CM | POA: Insufficient documentation

## 2023-09-24 DIAGNOSIS — Z3200 Encounter for pregnancy test, result unknown: Secondary | ICD-10-CM | POA: Insufficient documentation

## 2023-09-24 DIAGNOSIS — R110 Nausea: Secondary | ICD-10-CM

## 2023-09-24 DIAGNOSIS — E860 Dehydration: Secondary | ICD-10-CM | POA: Insufficient documentation

## 2023-09-24 DIAGNOSIS — R42 Dizziness and giddiness: Secondary | ICD-10-CM | POA: Insufficient documentation

## 2023-09-24 DIAGNOSIS — R55 Syncope and collapse: Secondary | ICD-10-CM

## 2023-09-24 LAB — COMPREHENSIVE METABOLIC PANEL, NON-FASTING
ALBUMIN/GLOBULIN RATIO: 1.2 (ref 0.8–1.4)
ALBUMIN: 4.5 g/dL (ref 3.5–5.7)
ALKALINE PHOSPHATASE: 68 U/L (ref 34–104)
ALT (SGPT): 11 U/L (ref 7–52)
ANION GAP: 12 mmol/L (ref 4–13)
AST (SGOT): 18 U/L (ref 13–39)
BILIRUBIN TOTAL: 0.6 mg/dL (ref 0.3–1.0)
BUN/CREA RATIO: 10 (ref 6–22)
BUN: 9 mg/dL (ref 7–25)
CALCIUM, CORRECTED: 9.4 mg/dL (ref 8.9–10.8)
CALCIUM: 9.8 mg/dL (ref 8.6–10.3)
CHLORIDE: 108 mmol/L — ABNORMAL HIGH (ref 98–107)
CO2 TOTAL: 22 mmol/L (ref 21–31)
CREATININE: 0.9 mg/dL (ref 0.60–1.30)
ESTIMATED GFR: 91 mL/min/{1.73_m2} (ref >59)
GLOBULIN: 3.7 — ABNORMAL HIGH (ref 2.0–3.5)
GLUCOSE: 95 mg/dL (ref 74–109)
OSMOLALITY, CALCULATED: 282 mosm/kg (ref 270–290)
POTASSIUM: 4.7 mmol/L (ref 3.5–5.1)
PROTEIN TOTAL: 8.2 g/dL (ref 6.4–8.9)
SODIUM: 142 mmol/L (ref 136–145)

## 2023-09-24 LAB — ECG 12 LEAD
Atrial Rate: 75 {beats}/min
Calculated P Axis: 52 degrees
Calculated R Axis: 84 degrees
Calculated T Axis: 61 degrees
PR Interval: 170 ms
QRS Duration: 74 ms
QT Interval: 376 ms
QTC Calculation: 419 ms
Ventricular rate: 75 {beats}/min

## 2023-09-24 LAB — URINALYSIS, MICROSCOPIC
BACTERIA: NEGATIVE /[HPF]
RBCS: 6 /[HPF] — ABNORMAL HIGH (ref <4)
SQUAMOUS EPITHELIAL: 2 /[HPF] (ref <28)
WBCS: 11 /[HPF] — ABNORMAL HIGH (ref <6)

## 2023-09-24 LAB — CBC
HCT: 40.7 % (ref 31.2–41.9)
HGB: 14 g/dL (ref 10.9–14.3)
MCH: 30.5 pg (ref 24.7–32.8)
MCHC: 34.4 g/dL (ref 32.3–35.6)
MCV: 88.9 fL (ref 75.5–95.3)
MPV: 7.4 fL — ABNORMAL LOW (ref 7.9–10.8)
PLATELETS: 276 10*3/uL (ref 140–440)
RBC: 4.58 10*6/uL (ref 3.63–4.92)
RDW: 13.5 % (ref 12.3–17.7)
WBC: 7.6 10*3/uL (ref 3.8–11.8)

## 2023-09-24 LAB — DRUG SCREEN, NO CONFIRMATION, URINE
AMPHETAMINES URINE: NEGATIVE
BARBITURATES URINE: NEGATIVE
BENZODIAZEPINES URINE: NEGATIVE
BUPRENORPHINE URINE: NEGATIVE
CANNABINOIDS URINE: NEGATIVE
COCAINE METABOLITES URINE: NEGATIVE
FENTANYL, URINE: NEGATIVE
METHADONE URINE: NEGATIVE
OPIATES URINE: NEGATIVE
OXYCODONE URINE: NEGATIVE
PCP URINE: NEGATIVE

## 2023-09-24 LAB — MAGNESIUM: MAGNESIUM: 2.1 mg/dL (ref 1.9–2.7)

## 2023-09-24 LAB — URINALYSIS, MACROSCOPIC
BILIRUBIN: NEGATIVE mg/dL
BLOOD: 1 mg/dL — AB
GLUCOSE: NEGATIVE mg/dL
KETONES: NEGATIVE mg/dL
LEUKOCYTES: 75 WBCs/uL — AB
NITRITE: NEGATIVE
PH: 6 (ref 5.0–9.0)
PROTEIN: NEGATIVE mg/dL
SPECIFIC GRAVITY: 1.01 (ref 1.002–1.030)
UROBILINOGEN: NORMAL mg/dL

## 2023-09-24 LAB — D-DIMER: D-DIMER: 323 ng{FEU}/mL (ref 215–500)

## 2023-09-24 LAB — HCG, SERUM QUALITATIVE, PREGNANCY: PREGNANCY, SERUM QUALITATIVE: NEGATIVE

## 2023-09-24 LAB — TROPONIN-I: TROPONIN I: 3 ng/L (ref <15)

## 2023-09-24 MED ORDER — HYDRALAZINE 25 MG TABLET
ORAL_TABLET | ORAL | Status: AC
Start: 2023-09-24 — End: 2023-09-24
  Filled 2023-09-24: qty 2

## 2023-09-24 MED ORDER — ONDANSETRON 4 MG DISINTEGRATING TABLET
4.0000 mg | ORAL_TABLET | ORAL | Status: AC
Start: 2023-09-24 — End: 2023-09-24
  Administered 2023-09-24: 4 mg via ORAL

## 2023-09-24 MED ORDER — SODIUM CHLORIDE 0.9 % IV BOLUS
1000.0000 mL | INJECTION | Status: AC
Start: 2023-09-24 — End: 2023-09-24
  Administered 2023-09-24: 0 mL via INTRAVENOUS
  Administered 2023-09-24: 1000 mL via INTRAVENOUS

## 2023-09-24 MED ORDER — ONDANSETRON 4 MG DISINTEGRATING TABLET
ORAL_TABLET | ORAL | Status: AC
Start: 2023-09-24 — End: 2023-09-24
  Filled 2023-09-24: qty 1

## 2023-09-24 NOTE — ED APP Handoff Note (Signed)
[facility] - Emergency Department  Emergency Department  Provider in Triage Note    Name: Sabrina Morgan  Age: 28 y.o.  Gender: female     Subjective:  This 28 year old female presents to the emergency department stating that she passed out yesterday while sitting on the toilet after urinating.  She also reports that she passed out today at her doctor's office while they were doing orthostatic vital signs.  She also complains of nausea and vomiting.  Sabrina Morgan is a 28 y.o. female who presents with complaint of Vomiting, Dizziness, and Syncope  .      Objective:  Alert x3 and in no acute distress.  Filed Vitals:    09/24/23 1101   BP: (!) 154/97   Pulse: 82   Resp: 18   Temp: 36.6 C (97.9 F)   SpO2: 100%      Focused Physical Exam shows airway is patent    Assessment:  A medical screening exam was completed.  This patient is a 28 y.o. female with initial findings showing respirations are even and nonlabored.    Plan:  Please see initial orders and work-up below.  This is to be continued with full evaluation in the main Emergency Department.     No current facility-administered medications for this encounter.     Results for orders placed or performed during the hospital encounter of 09/24/23 (from the past 24 hour(s))   URINALYSIS, MACROSCOPIC AND MICROSCOPIC W/CULTURE REFLEX    Specimen: Urine, Site not specified    Narrative    The following orders were created for panel order URINALYSIS, MACROSCOPIC AND MICROSCOPIC W/CULTURE REFLEX.  Procedure                               Abnormality         Status                     ---------                               -----------         ------                     URINALYSIS, MACROSCOPIC[[id number]]                                                     URINALYSIS, MICROSCOPIC[[id number]]                                                       Please view results for these tests on the individual orders.        Annamaria Helling, FNP-BC   09/24/2023, 11:02

## 2023-09-24 NOTE — ED Nurses Note (Signed)

## 2023-09-24 NOTE — ED Provider Notes (Signed)
[facility]  ED Primary Provider Note  Patient Name: Sabrina Morgan  Patient Age: 28 y.o.  Date of Birth: [birth year]    Chief Complaint: Vomiting, Dizziness, and Syncope        History of Present Illness       Sabrina Morgan is a 28 y.o. female who had concerns including Vomiting, Dizziness, and Syncope.  This patient is a 28 year female who presents with reported lightheadedness and syncope.  This has occurred multiple times over the past 2 days.  The primary care provider attempted to orthostatic vital signs resulting in a syncopal episode prompting presentation to the emergency department.  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        Review of Systems     No other overt Review of Systems are noted to be positive except noted in the HPI.      Historical Data   History Reviewed This Encounter:        Physical Exam   ED Triage Vitals [09/24/23 1101]   BP (Non-Invasive) (!) 154/97   Heart Rate 82   Respiratory Rate 18   Temperature 36.6 C (97.9 F)   SpO2 100 %   Weight 104 kg (230 lb)   Height 1.829 m (6')         Nursing notes reviewed for what could be assessed. Past Medical, Surgical, and Social history reviewed.     Constitutional: NAD. Well-Developed. Well Nourished.  Head: Normocephalic, atraumatic.  Mouth/Throat:  Symmetric facial movement.  Eyes: EOM grossly intact, conjunctiva normal.  Neck: Supple  Cardiovascular: Extremities well perfused.  Non tachycardic.  Pulmonary/Chest: No respiratory distress.   Abdominal: Non-distended. No overt peritoneal findings.   MSK: No Lower Extremity Edema.  Skin: Warm, dry.  Neuro: Appropriate, CN II-XII grossly intact.   Psych: Pleasant            Procedures      Patient Data     Labs Ordered/Reviewed   CBC - Abnormal; Notable for the following components:       Result Value    MPV 7.4 (*)     All other components within normal limits   URINALYSIS, MACROSCOPIC - Abnormal; Notable for the following  components:    LEUKOCYTES 75 (*)     BLOOD 1.0 (*)     All other components within normal limits   URINALYSIS, MICROSCOPIC - Abnormal; Notable for the following components:    RBCS 6 (*)     WBCS 11 (*)     All other components within normal limits   COMPREHENSIVE METABOLIC PANEL, NON-FASTING - Abnormal; Notable for the following components:    CHLORIDE 108 (*)     GLOBULIN 3.7 (*)     All other components within normal limits    Narrative:     Estimated Glomerular Filtration Rate (eGFR) is calculated using the CKD-EPI (2021) equation, intended for patients 85 years of age and older. If gender is not documented or "unknown", there will be no eGFR calculation.     MAGNESIUM - Normal   TROPONIN-I - Normal   DRUG SCREEN, NO CONFIRMATION, URINE - Normal    Narrative:     Any results reported as "positive" on this urine drug screen are unconfirmed screening results and should be used for medical(i.e.,treatment)purposes only. Unconfirmed screening results must not be used for non-medical purposes (e.g. employment or legal testing). Upon request, all results reported as "positive"  can be sent to a reference laboratory for confirmation by GCMS.     Reporting Limits (cut-off concentrations)     Cocaine 300 ng/mL  Opiates 300 ng/mL  THC 50 ng/mL  Amphetamine 1000 ng/mL  Phencyclidine 25 ng/mL  Benzodiazepine 300 ng/mL  Barbiturates 300 ng/mL  Methadone 300 ng/mL  Oxycodone 100 ng/mL  Buprenorphine 5 ng/mL  Fentanyl 5 ng/mL     HCG, SERUM QUALITATIVE, PREGNANCY - Normal   D-DIMER - Normal    Narrative:     D-Dimers are reported in FEU per ng/mL.    IF PATIENT IS EXHIBITING SYMPTOMS DVT/PE, THIS D-DIMER RESULT MAY INDICATE A NEED FOR FURTHER TESTING FOR THESE CONDITIONS. IF PATIENT IS SUSPECTED OF DIC AND SYMPTOMS WORSEN OR PERSIST, A REPEAT DIC WORKUP SHOULD BE CONSIDERED.    NOTE: ALTHOUGH THE NORMAL RANGE FOR THIS TEST IS 215-500 ng/mL FEU, LITERATURE RECOMMENDS FURTHER TESTING FOR ANY RESULT >500ng/mL FEU.    A cut off  value of 500ng/mL FEU or below can be used as an aid in the diagnosis of Thromboembolism when used in conjunction with the patient's medical history, clinical presentation and other findings. Results of 500ng/mL FEU or below have a negative predictive value of 100%.     URINE CULTURE,ROUTINE   URINALYSIS, MACROSCOPIC AND MICROSCOPIC W/CULTURE REFLEX    Narrative:     The following orders were created for panel order URINALYSIS, MACROSCOPIC AND MICROSCOPIC W/CULTURE REFLEX.  Procedure                               Abnormality         Status                     ---------                               -----------         ------                     URINALYSIS, MACROSCOPIC[[id number]]      Abnormal            Final result               URINALYSIS, MICROSCOPIC[[id number]]      Abnormal            Final result                 Please view results for these tests on the individual orders.       CT BRAIN WO IV CONTRAST   Final Result by Edi, Radresults In (02/03 1233)   NO ACUTE FINDINGS         One or more dose reduction techniques were used (e.g., Automated exposure control, adjustment of the mA and/or kV according to patient size, use of iterative reconstruction technique).         Radiologist location ID: [id number]         CT CERVICAL SPINE WO IV CONTRAST   Final Result by Edi, Radresults In (02/03 1236)   NO ACUTE CERVICAL FRACTURE         One or more dose reduction techniques were used (e.g., Automated exposure control, adjustment of the mA and/or kV according to patient size, use of iterative reconstruction technique).         Radiologist location  ID: [id number]             Medical Decision Making          Medical Decision Making          Studies Assessed:  Lab,    EKG:   This independent EKG interpretation by that has noted below, this will be reviewed and documented separately by a cardiologist in the EMR.:    Rate:  75 beats per minute    Interpretation:  PR 170, No consistent ST Elevation, No Acute STEMI  Identified.      MDM Narrative:  This patient is a 28 year female who presents with reported lightheadedness multiple syncopal episodes.  She reported striking her head.  Differential includes intracranial abnormality, bony fracture, dehydration, metabolic abnormality.  CT did not have any acute intracranial abnormality: However the patient did have positive findings when nursing attempted orthostatic vital signs.  IV fluids were given addition to Zofran.  The patient felt improved thereafter.  I did offer further observation, however the patient is comfortable being observed at home with oral rehydration.  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  Return precautions given.        Differential includes but not limited to:  Dehydration: Likely  Intracranial abnormality:  None noted  Infectious etiology: Abnormal urinalysis, however we will await culture  PE: D-dimer is not elevated making this unlikely      Follow-Up Discussion:  Patient appears comfortable    Please see documentation above for specific labs and radiology.    Decision for and Complexity of Risk in the ED encounter:  I ordered prescription medications requiring authorization in the ED or at discharge.  Independent/Additional Historian:  Patient's mother with information given about the patient.                 ED Course as of 09/24/23 2152   Mon Sep 24, 2023   1428 D-DIMER: 323  Not elevated   1509 Followed up with the patient.  Patient does feel improved after IV fluids.  Zofran was also given.  We will await culture for the abnormal urinalysis.  She feels comfortable being discharged at this point in time, and agreeable to stay rehydrated.  I also specifically asked that she be seen by her primary care provider tomorrow in ED Follow-Up.  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               Medications Administered in the ED   ondansetron (ZOFRAN ODT) rapid dissolve tablet (4 mg Oral Given  09/24/23 1259)   NS bolus infusion 1,000 mL (0 mL Intravenous Stopped 09/24/23 1403)       Following the history, physical exam, and ED workup, the patient was deemed stable and suitable for discharge. The patient/caregiver was advised to return to the ED for any new or worsening symptoms. Discharge medications, and follow-up instructions were discussed with the patient/caregiver in detail, who verbalizes understanding. The patient/caregiver is in agreement and is comfortable with the plan of care.    Disposition: Discharged         Current Discharge Medication List        CONTINUE these medications - NO CHANGES were made during your visit.        Details   amLODIPine 5 mg Tablet  Commonly known as: NORVASC   5 mg, Oral, DAILY  Qty: 90 Tablet  Refills:  4     ketorolac tromethamine 10 mg Tablet  Commonly known as: TORADOL   10 mg, Oral, EVERY 6 HOURS PRN  Qty: 12 Tablet  Refills: 0     lisinopriL 10 mg Tablet  Commonly known as: PRINIVIL   10 mg, Oral, DAILY  Refills: 0     LORazepam 0.5 mg Tablet  Commonly known as: ATIVAN   0.5 mg, Oral, 2 TIMES DAILY PRN  Refills: 0     * ondansetron 4 mg Tablet, Rapid Dissolve  Commonly known as: ZOFRAN ODT   4 mg, Oral, EVERY 8 HOURS PRN  Qty: 12 Tablet  Refills: 0     * ondansetron 4 mg Tablet, Rapid Dissolve  Commonly known as: ZOFRAN ODT   4 mg, Oral, EVERY 8 HOURS PRN  Qty: 12 Tablet  Refills: 0     pantoprazole 40 mg Tablet, Delayed Release (E.C.)  Commonly known as: PROTONIX   40 mg, Oral, DAILY  Qty: 30 Tablet  Refills: 0     traMADol-acetaminophen 37.5-325 mg Tablet  Commonly known as: ULTRACET   1 Tablet, Oral, EVERY 6 HOURS PRN  Qty: 18 Tablet  Refills: 0     Viibryd 20 mg Tablet  Generic drug: vilazodone   20 mg, Oral, DAILY  Refills: 0     Wegovy 0.25 mg/0.5 mL Pen Injector  Generic drug: semaglutide (weight loss)   0.25 mg, Subcutaneous, EVERY 7 DAYS  Refills: 0           * This list has 2 medication(s) that are the same as other medications prescribed for you. Read the  directions carefully, and ask your doctor or other care provider to review them with you.                Follow up:   No follow-up provider specified.             "Risk" as noted in the medical decision-making is in reference to potential morbidity/mortality of management based upon previously established billing guidelines.    Based upon the clinical setting, the likely diagnosis/impression include:    Clinical Impression   Lightheaded (Primary)   Dehydration   Nausea         Discharge Medication List as of 09/24/2023  3:10 PM                This note was partially created using voice recognition software and is inherently subject to errors including those of syntax and "sound alike " substitutions which may escape proof reading. In such instances, original meaning may be extrapolated by contextual derivation.      /R. Tobey Bride, MD, [facility]  Department of Emergency Medicine  [facility]

## 2023-09-24 NOTE — ED Nurses Note (Signed)
Sent pt to bathroom for urine recollect

## 2023-09-24 NOTE — ED Nurses Note (Signed)
Lab called and patient urine needs recollected. Gave patient hat,cup, cleansing wipe with bio bag for new specimen.

## 2023-09-24 NOTE — ED Triage Notes (Signed)
Passed put at home yesterday 0 hit head on the bathroom sink. Passed out again when doing orthostatic BP at the doctors office. Vomiting this AM.

## 2023-09-24 NOTE — Discharge Instructions (Signed)
 Thank you for allowing [place] to be part of your care.    Please discuss all medications with your pharmacist to ensure there are no concerns of interactions.    We discussed further work-up/observation for your complaint, however you wanted to go home. Please follow-up with your regular doctor. By going home, it is possible there are problems which go undiagnosed, which can result in bad outcomes.    Please ensure all questions or concerns are addressed prior to leaving the hospital. We want to make sure your concerns are addressed to make sure you are as safe and healthy as possible. By leaving the hospital, it is understood you are in agreement with your treatment plan.    You may have received sedating medication during your visit. Please discuss this with your discharging provider nurse as you may not be able to operate machines while the medication is in your system, or while you are taking any potentially sedating prescriptions.    Please call the hospital medical records office for a copy of your finalized results, and review them with a primary care physician, for any findings needing further attention.    If you feel your situation worsens, or does not get better in 48 hours, please see a physician for evaluation.    We encourage you to see your regular doctor as soon as possible to let them know you were seen in the emergency department. They may want to do further testing. If you do not have a doctor, please feel free to call the hospital, and ask for contact information of accepting providers. Please also discuss your vaccinations, and ensure all are up to date.    You may use this document to take today off of Work/School.

## 2023-09-25 ENCOUNTER — Other Ambulatory Visit (HOSPITAL_COMMUNITY): Payer: Self-pay | Admitting: NURSE PRACTITIONER

## 2023-09-25 DIAGNOSIS — R55 Syncope and collapse: Secondary | ICD-10-CM

## 2023-09-26 ENCOUNTER — Ambulatory Visit (HOSPITAL_COMMUNITY): Payer: BC Managed Care – PPO

## 2023-09-26 LAB — URINE CULTURE,ROUTINE: URINE CULTURE: 50000 — AB

## 2023-10-01 ENCOUNTER — Ambulatory Visit (HOSPITAL_COMMUNITY): Payer: BC Managed Care – PPO

## 2023-11-30 ENCOUNTER — Other Ambulatory Visit: Payer: Self-pay

## 2023-11-30 ENCOUNTER — Emergency Department (HOSPITAL_BASED_OUTPATIENT_CLINIC_OR_DEPARTMENT_OTHER)

## 2023-11-30 ENCOUNTER — Emergency Department
Admission: EM | Admit: 2023-11-30 | Discharge: 2023-11-30 | Disposition: A | Discharge status: Home / Self Care | Source: Home / Self Care | Attending: FAMILY PRACTICE | Admitting: FAMILY PRACTICE

## 2023-11-30 ENCOUNTER — Encounter (HOSPITAL_BASED_OUTPATIENT_CLINIC_OR_DEPARTMENT_OTHER): Payer: Self-pay

## 2023-11-30 DIAGNOSIS — W208XXA Other cause of strike by thrown, projected or falling object, initial encounter: Secondary | ICD-10-CM | POA: Insufficient documentation

## 2023-11-30 DIAGNOSIS — S60211A Contusion of right wrist, initial encounter: Secondary | ICD-10-CM | POA: Insufficient documentation

## 2023-11-30 DIAGNOSIS — Y9389 Activity, other specified: Secondary | ICD-10-CM

## 2023-11-30 DIAGNOSIS — Y92812 Truck as the place of occurrence of the external cause: Secondary | ICD-10-CM

## 2023-11-30 MED ORDER — IBUPROFEN 200 MG TABLET
400.0000 mg | ORAL_TABLET | Freq: Four times a day (QID) | ORAL | Status: AC | PRN
Start: 2023-11-30 — End: 2023-12-02

## 2023-11-30 NOTE — ED Nurses Note (Signed)
 Pt with new orders to DC home. Ensured that dc note was updated by physician. Discussed dc wtih patient. Printed dc paperwork and provided pt wtih a copy, reading over important points to pt. All of the pt's questions answered at this time. Provided pt with clinic phone number for follow-up questions.  Pt transported off ward via ambulatory with self.

## 2023-11-30 NOTE — ED Provider Notes (Signed)
 [facility] - Emergency Department  ED Primary Provider Note  History of Present Illness   Chief Complaint   Patient presents with    Wrist Injury     Complains of injury to right wrist approx 2-3 hours ago.  States was moving a couch and it dropped on right wrist/distal forearm.   Complains of pain.   No deformity noted.  Good radial pulse.       Sabrina Morgan is a 28 y.o. female who had concerns including Wrist Injury.  Arrival: The patient arrived by Car    This 28 year old female patient was moving catheter while ago, dropped the couch on the pickup truck bed loading it into the truck, she had immediate pain on the flexor surface of the radial aspect of the arm, and rapidly developing bruise on the thenar eminence.  She also has some swelling, bruising and tenderness on the distal ulna on the dorsal aspect of the right arm as well.  No other injuries.  Past medical history is significant for cholelithiasis, hypertension, fatty liver disease, glucose intolerance and seizures.  Past surgeries tympanostomy tube, laparoscopic cholecystectomy, right knee surgery, upper endoscopy, tonsillectomy and gastric sleeve.  No tobacco, vaping, marijuana or illicit drugs.  She was not occasional alcohol user, no alcohol recently.    She has been COVID vaccinated and influenza vaccinated for this past season.  She has not had any COVID boosters.  She has had COVID twice, has not influenza or RSV this season.      Wrist Injury    History Reviewed This Encounter:  Past medical, surgical, social history reviewed noted.    Physical Exam   ED Triage Vitals [11/30/23 1436]   BP (Non-Invasive) (!) 149/81   Heart Rate 80   Respiratory Rate 18   Temperature (!) 35.8 C (96.5 F)   SpO2 98 %   Weight 104 kg (229 lb)   Height 1.829 m (6')     Physical Exam  General: No acute distress nontoxic   Right forearm and wrist:  Mild swelling and tenderness without crepitus noted at the distal ulna on the  extensor aspect of the wrist.  No crepitus deformity noted in the carpals or metacarpals.  On the flexor surface of the wrist she has thenar eminence bruising, bruise on the distal radial forearm area, tenderness palpation given no crepitus or deformity.  Neurovascular:  Good sensory motor, 2/4 radial pulses, capillary refill is brisk.  Neurovascular is intact distal in the right hand.    Patient Data   Labs Ordered/Reviewed - No data to display  XR WRIST RIGHT   Final Result by Edi, Radresults In (04/11 1519)   NO ACUTE FRACTURE OR DISLOCATION.       If acute hand or wrist trauma is suspected and initial radiographs are negative or equivocal, repeat radiographs in 10-14 days, MRI without IV contrast, or CT without IV contrast is usually appropriate as the next imaging study. (ACR Appropriateness Criteria: Acute Hand and Wrist Trauma, 2018)                Radiologist location ID: [id number]           Medical Decision Making        Medical Decision Making  Low complexity straightforward differential includes contusion, wrist fracture dislocation, distal radial ulnar fracture or dislocation, metacarpal fractures.    Radiographs shows no fractures, dislocations.    Amount and/or Complexity of Data Reviewed  Radiology: ordered.  Details: Radiographs reviewed and noted.    Risk  OTC drugs.                   Clinical Impression   Contusion of right wrist, initial encounter (Primary)       Disposition: Discharged

## 2023-11-30 NOTE — Discharge Instructions (Signed)
 You have a wrist contusion, wrist range-of-motion exercises for flexion, extension, pronation translation need to be done several times a day.  Ice for 48 hours, 20 minute cycles every couple of hours.  You can do ibuprofen 400 mg 3 times a day with food for 2 days and stop.  Do not use ibuprofen or other NSAIDs for more than 48 hours diff here history of gastric bypass.    Continue other home medications as prescribed, call follow up primary care.

## 2023-12-17 ENCOUNTER — Encounter (HOSPITAL_BASED_OUTPATIENT_CLINIC_OR_DEPARTMENT_OTHER): Payer: Self-pay

## 2023-12-17 ENCOUNTER — Other Ambulatory Visit: Payer: Self-pay

## 2023-12-17 ENCOUNTER — Emergency Department
Admission: EM | Admit: 2023-12-17 | Discharge: 2023-12-17 | Disposition: A | Discharge status: Home / Self Care | Attending: Family | Admitting: Family

## 2023-12-17 DIAGNOSIS — R1110 Vomiting, unspecified: Secondary | ICD-10-CM | POA: Insufficient documentation

## 2023-12-17 DIAGNOSIS — R252 Cramp and spasm: Secondary | ICD-10-CM | POA: Insufficient documentation

## 2023-12-17 DIAGNOSIS — R197 Diarrhea, unspecified: Secondary | ICD-10-CM | POA: Insufficient documentation

## 2023-12-17 LAB — URINALYSIS, MACRO/MICRO
BILIRUBIN: NEGATIVE mg/dL
BLOOD: NEGATIVE mg/dL
GLUCOSE: NEGATIVE mg/dL
KETONES: NEGATIVE mg/dL
NITRITE: NEGATIVE
PH: 6 (ref 4.6–8.0)
PROTEIN: NEGATIVE mg/dL
SPECIFIC GRAVITY: >=1.03 (ref 1.003–1.035)
UROBILINOGEN: 0.2 mg/dL (ref 0.2–1.0)

## 2023-12-17 LAB — COMPREHENSIVE METABOLIC PANEL, NON-FASTING
ALBUMIN/GLOBULIN RATIO: 1 (ref 0.8–1.4)
ALBUMIN: 3.7 g/dL (ref 3.4–5.0)
ALKALINE PHOSPHATASE: 70 U/L (ref 46–116)
ALT (SGPT): 23 U/L (ref <=78)
ANION GAP: 7 mmol/L (ref 4–13)
AST (SGOT): 14 U/L — ABNORMAL LOW (ref 15–37)
BILIRUBIN TOTAL: 0.5 mg/dL (ref 0.2–1.0)
BUN/CREA RATIO: 16
BUN: 16 mg/dL (ref 7–18)
CALCIUM, CORRECTED: 9.4 mg/dL
CALCIUM: 9.2 mg/dL (ref 8.5–10.1)
CHLORIDE: 105 mmol/L (ref 98–107)
CO2 TOTAL: 27 mmol/L (ref 21–32)
CREATININE: 0.99 mg/dL (ref 0.55–1.02)
ESTIMATED GFR: 81 mL/min/{1.73_m2} (ref >59)
GLOBULIN: 3.8
GLUCOSE: 91 mg/dL (ref 74–106)
OSMOLALITY, CALCULATED: 278 mosm/kg (ref 270–290)
POTASSIUM: 4.3 mmol/L (ref 3.5–5.1)
PROTEIN TOTAL: 7.5 g/dL (ref 6.4–8.2)
SODIUM: 139 mmol/L (ref 136–145)

## 2023-12-17 LAB — CBC WITH DIFF
BASOPHIL #: 0.03 10*3/uL (ref 0.00–0.10)
BASOPHIL %: 0 % (ref 0–1)
EOSINOPHIL #: 0.09 10*3/uL (ref 0.00–0.50)
EOSINOPHIL %: 1 % (ref 1–7)
HCT: 38.3 % (ref 31.2–41.9)
HGB: 13.2 g/dL (ref 10.9–14.3)
LYMPHOCYTE #: 2.52 10*3/uL (ref 1.10–3.10)
LYMPHOCYTE %: 29 % (ref 16–46)
MCH: 31.2 pg (ref 24.7–32.8)
MCHC: 34.4 g/dL (ref 32.3–35.6)
MCV: 90.8 fL (ref 75.5–95.3)
MONOCYTE #: 0.64 10*3/uL (ref 0.20–0.90)
MONOCYTE %: 7 % (ref 4–11)
MPV: 7.1 fL — ABNORMAL LOW (ref 7.9–10.8)
NEUTROPHIL #: 5.52 10*3/uL (ref 1.90–8.20)
NEUTROPHIL %: 63 % (ref 43–77)
PLATELETS: 240 10*3/uL (ref 140–440)
RBC: 4.22 10*6/uL (ref 3.63–4.92)
RDW: 15.2 % (ref 12.3–17.7)
WBC: 8.8 10*3/uL (ref 3.8–11.8)

## 2023-12-17 LAB — MAGNESIUM: MAGNESIUM: 1.8 mg/dL (ref 1.8–2.4)

## 2023-12-17 LAB — LIPASE: LIPASE: 30 U/L (ref 16–77)

## 2023-12-17 LAB — URINALYSIS, MICROSCOPIC

## 2023-12-17 MED ORDER — SODIUM CHLORIDE 0.9 % IV BOLUS
1000.0000 mL | INJECTION | Status: AC
Start: 2023-12-17 — End: 2023-12-17
  Administered 2023-12-17: 1000 mL via INTRAVENOUS
  Administered 2023-12-17: 0 mL via INTRAVENOUS

## 2023-12-17 MED ORDER — ONDANSETRON HCL (PF) 4 MG/2 ML INJECTION SOLUTION
INTRAMUSCULAR | Status: AC
Start: 2023-12-17 — End: 2023-12-17
  Filled 2023-12-17: qty 2

## 2023-12-17 MED ORDER — ONDANSETRON 4 MG DISINTEGRATING TABLET
4.0000 mg | ORAL_TABLET | Freq: Three times a day (TID) | ORAL | 0 refills | Status: DC | PRN
Start: 2023-12-17 — End: 2024-07-08

## 2023-12-17 MED ORDER — FAMOTIDINE (PF) 20 MG/2 ML INTRAVENOUS SOLUTION
20.0000 mg | INTRAVENOUS | Status: AC
Start: 2023-12-17 — End: 2023-12-17
  Administered 2023-12-17: 20 mg via INTRAVENOUS

## 2023-12-17 MED ORDER — PROCHLORPERAZINE 25 MG RECTAL SUPPOSITORY
25.0000 mg | Freq: Two times a day (BID) | RECTAL | 0 refills | Status: AC | PRN
Start: 2023-12-17 — End: 2023-12-24

## 2023-12-17 MED ORDER — KETOROLAC 30 MG/ML (1 ML) INJECTION SOLUTION
30.0000 mg | INTRAMUSCULAR | Status: AC
Start: 2023-12-17 — End: 2023-12-17
  Administered 2023-12-17: 30 mg via INTRAVENOUS

## 2023-12-17 MED ORDER — ONDANSETRON HCL (PF) 4 MG/2 ML INJECTION SOLUTION
4.0000 mg | INTRAMUSCULAR | Status: AC
Start: 2023-12-17 — End: 2023-12-17
  Administered 2023-12-17: 4 mg via INTRAVENOUS

## 2023-12-17 MED ORDER — SODIUM CHLORIDE 0.9 % (FLUSH) INJECTION SYRINGE
3.0000 mL | INJECTION | Freq: Three times a day (TID) | INTRAMUSCULAR | Status: DC
Start: 2023-12-17 — End: 2023-12-18
  Administered 2023-12-17: 0 mL

## 2023-12-17 MED ORDER — PANTOPRAZOLE 40 MG TABLET,DELAYED RELEASE
40.0000 mg | DELAYED_RELEASE_TABLET | Freq: Every day | ORAL | 0 refills | Status: AC
Start: 2023-12-17 — End: 2023-12-31

## 2023-12-17 MED ORDER — PANTOPRAZOLE 40 MG INTRAVENOUS SOLUTION
40.0000 mg | INTRAVENOUS | Status: AC
Start: 2023-12-17 — End: 2023-12-17
  Administered 2023-12-17: 40 mg via INTRAVENOUS

## 2023-12-17 MED ORDER — SODIUM CHLORIDE 0.9 % (FLUSH) INJECTION SYRINGE
3.0000 mL | INJECTION | INTRAMUSCULAR | Status: DC | PRN
Start: 2023-12-17 — End: 2023-12-18

## 2023-12-17 MED ORDER — KETOROLAC 30 MG/ML (1 ML) INJECTION SOLUTION
INTRAMUSCULAR | Status: AC
Start: 2023-12-17 — End: 2023-12-17
  Filled 2023-12-17: qty 1

## 2023-12-17 MED ORDER — FAMOTIDINE (PF) 20 MG/2 ML INTRAVENOUS SOLUTION
INTRAVENOUS | Status: AC
Start: 2023-12-17 — End: 2023-12-17
  Filled 2023-12-17: qty 2

## 2023-12-17 MED ORDER — PANTOPRAZOLE 40 MG INTRAVENOUS SOLUTION
INTRAVENOUS | Status: AC
Start: 2023-12-17 — End: 2023-12-17
  Filled 2023-12-17: qty 10

## 2023-12-17 MED ORDER — FAMOTIDINE 40 MG TABLET
40.0000 mg | ORAL_TABLET | Freq: Two times a day (BID) | ORAL | 0 refills | Status: AC
Start: 2023-12-17 — End: 2023-12-31

## 2023-12-17 NOTE — ED Provider Notes (Signed)
 [facility], [place] - Emergency Department  ED Primary Provider Note  History of Present Illness   Chief Complaint   Patient presents with    Vomiting    Diarrhea     PT states n/v/d since Friday, and has been mostly unable to hold down any fluids since sat afternoon even with Zofran . Diarrhea starting today. Muscle cramping.      Arrival: The patient arrived by Car  Sabrina Morgan is a 28 y.o. female who had concerns including Vomiting and Diarrhea. Pt states nv multiple times since  Friday. Took wt loss shot on Thursday states no dose changes in a year.  Diarrhea started today. States muscle cramps.  Review of Systems   Constitutional: No fever, chills or weakness   Skin: No rash or diaphoresis  HENT: No headaches, or congestion  Eyes: No vision changes or photophobia   Cardio: No chest pain, palpitations or leg swelling   Respiratory: No cough, wheezing or SOB  GI:  + nausea, vomiting  stool changes  GU:  No dysuria, hematuria, or increased frequency  MSK: + muscle aches, joint pain  Neuro: No seizures, LOC, numbness, tingling, or focal weakness  Psychiatric: No depression, SI or substance abuse  All other systems reviewed and are negative.    History Reviewed This Encounter: all  noted and reviewed.     Physical Exam   ED Triage Vitals [12/17/23 2000]   BP (Non-Invasive) (!) 132/93   Heart Rate 75   Respiratory Rate 16   Temperature 36.6 C (97.8 F)   SpO2 99 %   Weight 100 kg (221 lb)   Height 1.829 m (6')     Constitutional:  28 y.o. female who appears in no distress. Normal color, no cyanosis.   HENT:   Head: Normocephalic and atraumatic.   Mouth/Throat: Oropharynx is clear and dry   Eyes: EOMI, PERRL   Neck: Trachea midline. Neck supple.  Cardiovascular: RRR, No murmurs, rubs or gallops. Intact distal pulses.  Pulmonary/Chest: BS equal bilaterally. No respiratory distress. No wheezes, rales or chest tenderness.   Abdominal: Bowel sounds present and normal. Abdomen soft, no  tenderness, no rebound and no guarding.  Back: No midline spinal tenderness, no paraspinal tenderness, no CVA tenderness.           Musculoskeletal: No edema, tenderness or deformity.  Skin: warm and dry. No rash, erythema, pallor or cyanosis  Psychiatric: normal mood and affect. Behavior is normal.   Neurological: Patient keenly alert and responsive, easily able to raise eyebrows, facial muscles/expressions symmetric, speaking in fluent sentences, moving all extremities equally and fully, normal gait  Patient Data   No orders to display     Medical Decision Making   Diff dx of ge side effects of medication dehydration. Pud. Pt had no nvd in er. Pt felt some better          Medications Ordered/Administered in the ED   NS flush syringe (has no administration in time range)   NS flush syringe (has no administration in time range)   NS bolus infusion 1,000 mL (1,000 mL Intravenous New Bag/New Syringe 12/17/23 2129)   famotidine  (PEPCID ) 10 mg/mL injection (20 mg Intravenous Given 12/17/23 2128)   pantoprazole  (PROTONIX ) 4 mg/mL injection (40 mg Intravenous Given 12/17/23 2129)   ondansetron  (ZOFRAN ) 2 mg/mL injection (4 mg Intravenous Given 12/17/23 2128)   ketorolac  (TORADOL ) 30 mg/mL injection (30 mg Intravenous Given 12/17/23 2128)     Clinical Impression  Vomiting and diarrhea (Primary)   Muscle cramps       Disposition: Discharged

## 2023-12-17 NOTE — ED Nurses Note (Signed)

## 2023-12-20 LAB — URINE CULTURE,ROUTINE: URINE CULTURE: NO GROWTH

## 2023-12-21 ENCOUNTER — Other Ambulatory Visit (HOSPITAL_COMMUNITY): Payer: Self-pay | Admitting: NURSE PRACTITIONER

## 2023-12-21 DIAGNOSIS — R55 Syncope and collapse: Secondary | ICD-10-CM

## 2023-12-28 ENCOUNTER — Ambulatory Visit
Admission: RE | Admit: 2023-12-28 | Discharge: 2023-12-28 | Disposition: A | Discharge status: Home / Self Care | Source: Ambulatory Visit | Attending: NURSE PRACTITIONER

## 2023-12-28 ENCOUNTER — Other Ambulatory Visit: Payer: Self-pay

## 2023-12-28 DIAGNOSIS — R55 Syncope and collapse: Secondary | ICD-10-CM

## 2024-01-03 ENCOUNTER — Emergency Department (HOSPITAL_BASED_OUTPATIENT_CLINIC_OR_DEPARTMENT_OTHER)

## 2024-01-03 ENCOUNTER — Other Ambulatory Visit: Payer: Self-pay

## 2024-01-03 ENCOUNTER — Emergency Department
Admission: EM | Admit: 2024-01-03 | Discharge: 2024-01-04 | Disposition: A | Discharge status: Home / Self Care | Attending: Emergency Medicine | Admitting: Emergency Medicine

## 2024-01-03 ENCOUNTER — Encounter (HOSPITAL_BASED_OUTPATIENT_CLINIC_OR_DEPARTMENT_OTHER): Payer: Self-pay

## 2024-01-03 DIAGNOSIS — Z3200 Encounter for pregnancy test, result unknown: Secondary | ICD-10-CM | POA: Insufficient documentation

## 2024-01-03 DIAGNOSIS — R55 Syncope and collapse: Secondary | ICD-10-CM | POA: Insufficient documentation

## 2024-01-03 DIAGNOSIS — E876 Hypokalemia: Secondary | ICD-10-CM | POA: Insufficient documentation

## 2024-01-03 DIAGNOSIS — R079 Chest pain, unspecified: Secondary | ICD-10-CM

## 2024-01-03 DIAGNOSIS — E860 Dehydration: Secondary | ICD-10-CM | POA: Insufficient documentation

## 2024-01-03 DIAGNOSIS — R9431 Abnormal electrocardiogram [ECG] [EKG]: Secondary | ICD-10-CM | POA: Insufficient documentation

## 2024-01-03 DIAGNOSIS — R002 Palpitations: Secondary | ICD-10-CM | POA: Insufficient documentation

## 2024-01-03 DIAGNOSIS — N390 Urinary tract infection, site not specified: Secondary | ICD-10-CM | POA: Insufficient documentation

## 2024-01-03 LAB — CBC WITH DIFF
BASOPHIL #: 0.03 10*3/uL (ref 0.00–0.10)
BASOPHIL %: 0 % (ref 0–1)
EOSINOPHIL #: 0.06 10*3/uL (ref 0.00–0.50)
EOSINOPHIL %: 1 % (ref 1–7)
HCT: 39.4 % (ref 31.2–41.9)
HGB: 13.6 g/dL (ref 10.9–14.3)
LYMPHOCYTE #: 2.27 10*3/uL (ref 1.10–3.10)
LYMPHOCYTE %: 29 % (ref 16–46)
MCH: 31.5 pg (ref 24.7–32.8)
MCHC: 34.4 g/dL (ref 32.3–35.6)
MCV: 91.4 fL (ref 75.5–95.3)
MONOCYTE #: 0.46 10*3/uL (ref 0.20–0.90)
MONOCYTE %: 6 % (ref 4–11)
MPV: 7.2 fL — ABNORMAL LOW (ref 7.9–10.8)
NEUTROPHIL #: 5.11 10*3/uL (ref 1.90–8.20)
NEUTROPHIL %: 64 % (ref 43–77)
PLATELETS: 246 10*3/uL (ref 140–440)
RBC: 4.31 10*6/uL (ref 3.63–4.92)
RDW: 15.3 % (ref 12.3–17.7)
WBC: 7.9 10*3/uL (ref 3.8–11.8)

## 2024-01-03 LAB — COMPREHENSIVE METABOLIC PANEL, NON-FASTING
ALBUMIN/GLOBULIN RATIO: 1.1 (ref 0.8–1.4)
ALBUMIN: 4 g/dL (ref 3.4–5.0)
ALKALINE PHOSPHATASE: 71 U/L (ref 46–116)
ALT (SGPT): 19 U/L (ref <=78)
ANION GAP: 9 mmol/L (ref 4–13)
AST (SGOT): 15 U/L (ref 15–37)
BILIRUBIN TOTAL: 0.5 mg/dL (ref 0.2–1.0)
BUN/CREA RATIO: 9
BUN: 9 mg/dL (ref 7–18)
CALCIUM, CORRECTED: 9.4 mg/dL
CALCIUM: 9.4 mg/dL (ref 8.5–10.1)
CHLORIDE: 105 mmol/L (ref 98–107)
CO2 TOTAL: 27 mmol/L (ref 21–32)
CREATININE: 1.03 mg/dL — ABNORMAL HIGH (ref 0.55–1.02)
ESTIMATED GFR: 77 mL/min/{1.73_m2} (ref >59)
GLOBULIN: 3.8
GLUCOSE: 91 mg/dL (ref 74–106)
OSMOLALITY, CALCULATED: 280 mosm/kg (ref 270–290)
POTASSIUM: 3.1 mmol/L — ABNORMAL LOW (ref 3.5–5.1)
PROTEIN TOTAL: 7.8 g/dL (ref 6.4–8.2)
SODIUM: 141 mmol/L (ref 136–145)

## 2024-01-03 LAB — URINALYSIS, MICROSCOPIC

## 2024-01-03 LAB — URINALYSIS, MACRO/MICRO
BILIRUBIN: NEGATIVE mg/dL
BLOOD: NEGATIVE mg/dL
GLUCOSE: NEGATIVE mg/dL
KETONES: NEGATIVE mg/dL
NITRITE: NEGATIVE
PH: 7 (ref 4.6–8.0)
PROTEIN: NEGATIVE mg/dL
SPECIFIC GRAVITY: 1.02 (ref 1.003–1.035)
UROBILINOGEN: 0.2 mg/dL (ref 0.2–1.0)

## 2024-01-03 LAB — MAGNESIUM: MAGNESIUM: 1.9 mg/dL (ref 1.8–2.4)

## 2024-01-03 LAB — PT/INR
INR: 1.14 — ABNORMAL HIGH (ref 0.84–1.10)
PROTHROMBIN TIME: 12.9 s — ABNORMAL HIGH (ref 9.8–12.7)

## 2024-01-03 LAB — TROPONIN-I
TROPONIN I: 2 ng/L (ref <15)
TROPONIN I: <2 ng/L (ref <15)

## 2024-01-03 LAB — D-DIMER: D-DIMER: <215 ng{FEU}/mL — ABNORMAL LOW (ref 215–500)

## 2024-01-03 LAB — PTT (PARTIAL THROMBOPLASTIN TIME): APTT: 27.8 s (ref 25.0–38.0)

## 2024-01-03 LAB — HCG, SERUM QUALITATIVE, PREGNANCY: PREGNANCY, SERUM QUALITATIVE: NEGATIVE

## 2024-01-03 MED ORDER — POTASSIUM BICARBONATE-CITRIC ACID 25 MEQ EFFERVESCENT TABLET
25.0000 meq | EFFERVESCENT_TABLET | ORAL | Status: AC
Start: 2024-01-03 — End: 2024-01-03
  Administered 2024-01-03: 25 meq via ORAL

## 2024-01-03 MED ORDER — POTASSIUM BICARBONATE-CITRIC ACID 25 MEQ EFFERVESCENT TABLET
EFFERVESCENT_TABLET | ORAL | Status: AC
Start: 2024-01-03 — End: 2024-01-03
  Filled 2024-01-03: qty 1

## 2024-01-03 MED ORDER — ASPIRIN 81 MG CHEWABLE TABLET
324.0000 mg | CHEWABLE_TABLET | ORAL | Status: AC
Start: 2024-01-03 — End: 2024-01-03
  Administered 2024-01-03: 324 mg via ORAL

## 2024-01-03 MED ORDER — ONDANSETRON 4 MG DISINTEGRATING TABLET
4.0000 mg | ORAL_TABLET | ORAL | Status: AC
Start: 2024-01-03 — End: 2024-01-03
  Administered 2024-01-03: 4 mg via ORAL

## 2024-01-03 MED ORDER — ASPIRIN 81 MG CHEWABLE TABLET
CHEWABLE_TABLET | ORAL | Status: AC
Start: 2024-01-03 — End: 2024-01-03
  Filled 2024-01-03: qty 4

## 2024-01-03 MED ORDER — ONDANSETRON 4 MG DISINTEGRATING TABLET
ORAL_TABLET | ORAL | Status: AC
Start: 2024-01-03 — End: 2024-01-03
  Filled 2024-01-03: qty 1

## 2024-01-03 NOTE — ED Provider Notes (Signed)
 [facility], [place] - Emergency Department  ED Primary Provider Note  History of Present Illness   Chief Complaint   Patient presents with    Chest Pain      Patient states that she had just got back from the store and her head got swimmy headed and felt her heart started racing, then it dropped to the 40's and the n it went up to 146. Patient vomited x2 and passed and was out for 2 min. Patient called brother and states that he passed out again once he got there.      Arrival: The patient arrived by Car  Javaeh Thompsen is a 28 y.o. female who had concerns including Chest Pain . Pt states nv and syncopal episode in which brother kept her from getting hurt. Was placed in Holter monitor yesterday  for palpitation. States no pain now more like palpitations.   Review of Systems   Constitutional: No fever, chills + weakness   Skin: No rash or diaphoresis  HENT: No headaches, or congestion  Eyes: No vision changes or photophobia   Cardio: + chest pain, palpitations  syncope no leg swelling   Respiratory: No cough, wheezing or SOB  GI:  + nausea, vomiting no  stool changes  GU:  No dysuria, hematuria, or increased frequency  MSK: No muscle aches, joint or back pain  Neuro: No seizures, LOC, numbness, tingling, or focal weakness  Psychiatric: No depression, SI or substance abuse  All other systems reviewed and are negative.    History Reviewed This Encounter: all noted and reviewed.     Physical Exam   ED Triage Vitals [01/03/24 2117]   BP (Non-Invasive) (!) 152/94   Heart Rate 77   Respiratory Rate 18   Temperature 36.1 C (97 F)   SpO2 100 %   Weight 100 kg (221 lb)   Height 1.829 m (6')       Constitutional:  28 y.o. female who appears in no distress. Normal color, no cyanosis.   HENT:   Head: Normocephalic and atraumatic.   Mouth/Throat: Oropharynx is clear and dry .   Eyes: EOMI, PERRL   Neck: Trachea midline. Neck supple.  Cardiovascular: RRR, No murmurs, rubs or gallops. Intact distal  pulses.  Pulmonary/Chest: BS equal bilaterally. No respiratory distress. No wheezes, rales or chest tenderness.   Abdominal: Bowel sounds present and normal. Abdomen soft, no tenderness, no rebound and no guarding.  Back: No midline spinal tenderness, no paraspinal tenderness, no CVA tenderness.           Musculoskeletal: No edema, tenderness or deformity.  Skin: warm and dry. No rash, erythema, pallor or cyanosis  Psychiatric: normal mood and affect. Behavior is normal.   Neurological: Patient keenly alert and responsive, easily able to raise eyebrows, facial muscles/expressions symmetric, speaking in fluent sentences, moving all extremities equally and fully, normal gait  Patient Data   XR AP MOBILE CHEST   Final Result by Edi, Radresults In (05/15 2134)   No acute cardiopulmonary findings.                  Radiologist location ID: [id number]           Medical Decision Making   Diff dx of palpitation dehydration electrolyte imbalance coronary artery disease. K. 3.1 ddimer neg. Trop  neg mag 1.9. further testing is pending. Pt feeling some better. 2255 care left to dr. Hassie Lint .    Medications Ordered/Administered in the ED  aspirin chewable tablet 324 mg (324 mg Oral Given 01/03/24 2130)   ondansetron  (ZOFRAN  ODT) rapid dissolve tablet (4 mg Oral Given 01/03/24 2134)   potassium bicarbonate -citric acid  (EFFER-K ) effervescent tablet (25 mEq Oral Given 01/03/24 2245)     Clinical Impression   Palpitations (Primary)   Dehydration   Hypokalemia   Syncope, unspecified syncope type   Urinary tract infection without hematuria, site unspecified       Disposition: Discharged

## 2024-01-03 NOTE — Discharge Instructions (Signed)
Encourage p.o. fluids

## 2024-01-04 DIAGNOSIS — R9431 Abnormal electrocardiogram [ECG] [EKG]: Secondary | ICD-10-CM

## 2024-01-04 LAB — ECG 12 LEAD
Atrial Rate: 73 {beats}/min
Calculated P Axis: 51 degrees
Calculated R Axis: 104 degrees
Calculated T Axis: 0 degrees
PR Interval: 184 ms
QRS Duration: 86 ms
QT Interval: 388 ms
QTC Calculation: 427 ms
Ventricular rate: 73 {beats}/min

## 2024-01-04 NOTE — ED Nurses Note (Signed)
 Patient discharged home with family.  AVS reviewed with patient/care giver.  A written copy of the AVS and discharge instructions was given to the patient/care giver. Scripts handed to patient/care giver. Questions sufficiently answered as needed.  Patient/care giver encouraged to follow up with PCP as indicated.  In the event of an emergency, patient/care giver instructed to call 911 or go to the nearest emergency room.

## 2024-01-05 ENCOUNTER — Ambulatory Visit (HOSPITAL_BASED_OUTPATIENT_CLINIC_OR_DEPARTMENT_OTHER): Payer: Self-pay | Admitting: Family

## 2024-01-06 LAB — URINE CULTURE,ROUTINE: URINE CULTURE: 4000 — AB

## 2024-02-05 DIAGNOSIS — R55 Syncope and collapse: Secondary | ICD-10-CM

## 2024-02-05 DIAGNOSIS — I441 Atrioventricular block, second degree: Secondary | ICD-10-CM

## 2024-02-05 DIAGNOSIS — I471 Supraventricular tachycardia, unspecified: Secondary | ICD-10-CM

## 2024-02-05 LAB — MCOT - 14 DAY CONTINUOUS
AV block - number of episodes: 6
Heart rate (average): 76 {beats}/min
Isolated SVE count: 74 episodes
Isolated VE Counts: 39 episodes
Longest supraventricular tachycardia episode - duration: 14.7 s
Longest supraventricular tachycardia episode - heart rate (: 100 {beats}/min
Longest supraventricular tachycardia episode - number of be: 24 beats
SVE Couplets Counts: 7 episodes
Second degree AV block - min heart rate: 35 beats
Supraventricular tachycardia - heart rate (average): 100 {beats}/min
Supraventricular tachycardia - number of episodes: 1
Supraventricular tachycardia with fastest heart rate - dura: 14.7 s
Supraventricular tachycardia with fastest heart rate - hear: 100 {beats}/min
Supraventricular tachycardia with fastest heart rate - numb: 24 beats

## 2024-03-11 ENCOUNTER — Other Ambulatory Visit (HOSPITAL_COMMUNITY): Payer: Self-pay | Admitting: NURSE PRACTITIONER

## 2024-03-11 ENCOUNTER — Ambulatory Visit
Admission: RE | Admit: 2024-03-11 | Discharge: 2024-03-11 | Disposition: A | Discharge status: Home / Self Care | Source: Ambulatory Visit | Attending: NURSE PRACTITIONER | Admitting: NURSE PRACTITIONER

## 2024-03-11 ENCOUNTER — Other Ambulatory Visit: Payer: Self-pay

## 2024-03-11 DIAGNOSIS — M545 Low back pain, unspecified: Secondary | ICD-10-CM | POA: Insufficient documentation

## 2024-03-11 DIAGNOSIS — M25559 Pain in unspecified hip: Secondary | ICD-10-CM

## 2024-03-11 DIAGNOSIS — M47816 Spondylosis without myelopathy or radiculopathy, lumbar region: Secondary | ICD-10-CM

## 2024-03-11 DIAGNOSIS — M25551 Pain in right hip: Secondary | ICD-10-CM

## 2024-04-16 ENCOUNTER — Encounter (HOSPITAL_BASED_OUTPATIENT_CLINIC_OR_DEPARTMENT_OTHER): Payer: Self-pay

## 2024-04-16 ENCOUNTER — Other Ambulatory Visit: Payer: Self-pay

## 2024-04-16 ENCOUNTER — Emergency Department
Admission: EM | Admit: 2024-04-16 | Discharge: 2024-04-16 | Disposition: A | Discharge status: Home / Self Care | Attending: Emergency Medicine | Admitting: Emergency Medicine

## 2024-04-16 DIAGNOSIS — N390 Urinary tract infection, site not specified: Secondary | ICD-10-CM | POA: Insufficient documentation

## 2024-04-16 DIAGNOSIS — K2900 Acute gastritis without bleeding: Secondary | ICD-10-CM | POA: Insufficient documentation

## 2024-04-16 DIAGNOSIS — R112 Nausea with vomiting, unspecified: Secondary | ICD-10-CM

## 2024-04-16 LAB — URINALYSIS, MACRO/MICRO
BILIRUBIN: NEGATIVE mg/dL
GLUCOSE: NEGATIVE mg/dL
KETONES: NEGATIVE mg/dL
NITRITE: NEGATIVE
PH: 6 (ref 4.6–8.0)
PROTEIN: NEGATIVE mg/dL
SPECIFIC GRAVITY: 1.01 (ref 1.003–1.035)
UROBILINOGEN: 0.2 mg/dL (ref 0.2–1.0)

## 2024-04-16 LAB — CBC WITH DIFF
BASOPHIL #: 0.02 x10ˆ3/uL (ref 0.00–0.10)
BASOPHIL %: 0 % (ref 0–1)
EOSINOPHIL #: 0.04 x10ˆ3/uL (ref 0.00–0.50)
EOSINOPHIL %: 0 % — ABNORMAL LOW (ref 1–7)
HCT: 38.5 % (ref 31.2–41.9)
HGB: 13 g/dL (ref 10.9–14.3)
LYMPHOCYTE #: 1.32 x10ˆ3/uL (ref 1.10–3.10)
LYMPHOCYTE %: 15 % — ABNORMAL LOW (ref 16–46)
MCH: 30.3 pg (ref 24.7–32.8)
MCHC: 33.8 g/dL (ref 32.3–35.6)
MCV: 89.7 fL (ref 75.5–95.3)
MONOCYTE #: 0.39 x10ˆ3/uL (ref 0.20–0.90)
MONOCYTE %: 4 % (ref 4–11)
MPV: 7.3 fL — ABNORMAL LOW (ref 7.9–10.8)
NEUTROPHIL #: 7.06 x10ˆ3/uL (ref 1.90–8.20)
NEUTROPHIL %: 80 % — ABNORMAL HIGH (ref 43–77)
PLATELETS: 239 x10ˆ3/uL (ref 140–440)
RBC: 4.29 x10ˆ6/uL (ref 3.63–4.92)
RDW: 15 % (ref 12.3–17.7)
WBC: 8.8 x10ˆ3/uL (ref 3.8–11.8)

## 2024-04-16 LAB — COMPREHENSIVE METABOLIC PANEL, NON-FASTING
ALBUMIN/GLOBULIN RATIO: 0.9 (ref 0.8–1.4)
ALBUMIN: 3.9 g/dL (ref 3.4–5.0)
ALKALINE PHOSPHATASE: 64 U/L (ref 46–116)
ALT (SGPT): 17 U/L (ref <=78)
ANION GAP: 10 mmol/L (ref 4–13)
AST (SGOT): 9 U/L — ABNORMAL LOW (ref 15–37)
BILIRUBIN TOTAL: 0.6 mg/dL (ref 0.2–1.0)
BUN/CREA RATIO: 15
BUN: 13 mg/dL (ref 7–18)
CALCIUM, CORRECTED: 9.9 mg/dL
CALCIUM: 9.8 mg/dL (ref 8.5–10.1)
CHLORIDE: 104 mmol/L (ref 98–107)
CO2 TOTAL: 23 mmol/L (ref 21–32)
CREATININE: 0.88 mg/dL (ref 0.55–1.02)
ESTIMATED GFR: 93 mL/min/1.73mˆ2 (ref >59)
GLOBULIN: 4.2
GLUCOSE: 113 mg/dL — ABNORMAL HIGH (ref 74–106)
OSMOLALITY, CALCULATED: 275 mosm/kg (ref 270–290)
POTASSIUM: 4.1 mmol/L (ref 3.5–5.1)
PROTEIN TOTAL: 8.1 g/dL (ref 6.4–8.2)
SODIUM: 137 mmol/L (ref 136–145)

## 2024-04-16 LAB — URINALYSIS, MICROSCOPIC

## 2024-04-16 LAB — MAGNESIUM: MAGNESIUM: 2 mg/dL (ref 1.8–2.4)

## 2024-04-16 LAB — LIPASE: LIPASE: 30 U/L (ref 16–77)

## 2024-04-16 LAB — HCG QUALITATIVE PREGNANCY, SERUM: PREGNANCY, SERUM QUALITATIVE: NEGATIVE

## 2024-04-16 MED ORDER — SUCRALFATE 1 GRAM TABLET
1.0000 g | ORAL_TABLET | Freq: Three times a day (TID) | ORAL | 0 refills | Status: AC
Start: 2024-04-16 — End: 2024-04-30

## 2024-04-16 MED ORDER — ONDANSETRON HCL (PF) 4 MG/2 ML INJECTION SOLUTION
8.0000 mg | INTRAMUSCULAR | Status: AC
Start: 2024-04-16 — End: 2024-04-16
  Administered 2024-04-16: 8 mg via INTRAVENOUS

## 2024-04-16 MED ORDER — PROCHLORPERAZINE EDISYLATE 10 MG/2 ML (5 MG/ML) INJECTION SOLUTION
INTRAMUSCULAR | Status: AC
Start: 2024-04-16 — End: 2024-04-16
  Filled 2024-04-16: qty 2

## 2024-04-16 MED ORDER — PROCHLORPERAZINE EDISYLATE 10 MG/2 ML (5 MG/ML) INJECTION SOLUTION
5.0000 mg | INTRAMUSCULAR | Status: AC
Start: 2024-04-16 — End: 2024-04-16
  Administered 2024-04-16: 5 mg via INTRAVENOUS

## 2024-04-16 MED ORDER — PANTOPRAZOLE 40 MG INTRAVENOUS SOLUTION
INTRAVENOUS | Status: AC
Start: 2024-04-16 — End: 2024-04-16
  Filled 2024-04-16: qty 10

## 2024-04-16 MED ORDER — ONDANSETRON HCL (PF) 4 MG/2 ML INJECTION SOLUTION
INTRAMUSCULAR | Status: AC
Start: 2024-04-16 — End: 2024-04-16
  Filled 2024-04-16: qty 4

## 2024-04-16 MED ORDER — CEFUROXIME AXETIL 500 MG TABLET
500.0000 mg | ORAL_TABLET | Freq: Two times a day (BID) | ORAL | 0 refills | Status: AC
Start: 2024-04-16 — End: 2024-04-23

## 2024-04-16 MED ORDER — SODIUM CHLORIDE 0.9 % (FLUSH) INJECTION SYRINGE
3.0000 mL | INJECTION | INTRAMUSCULAR | Status: DC | PRN
Start: 2024-04-16 — End: 2024-04-16

## 2024-04-16 MED ORDER — SODIUM CHLORIDE 0.9 % (FLUSH) INJECTION SYRINGE
3.0000 mL | INJECTION | Freq: Three times a day (TID) | INTRAMUSCULAR | Status: DC
Start: 2024-04-16 — End: 2024-04-16
  Administered 2024-04-16: 0 mL

## 2024-04-16 MED ORDER — SODIUM CHLORIDE 0.9 % IV BOLUS
1000.0000 mL | INJECTION | Status: AC
Start: 2024-04-16 — End: 2024-04-16
  Administered 2024-04-16: 1000 mL via INTRAVENOUS
  Administered 2024-04-16: 0 mL via INTRAVENOUS

## 2024-04-16 MED ORDER — PROCHLORPERAZINE 25 MG RECTAL SUPPOSITORY
25.0000 mg | Freq: Two times a day (BID) | RECTAL | 0 refills | Status: DC | PRN
Start: 2024-04-16 — End: 2024-07-08

## 2024-04-16 MED ORDER — PANTOPRAZOLE 40 MG INTRAVENOUS SOLUTION
40.0000 mg | INTRAVENOUS | Status: AC
Start: 2024-04-16 — End: 2024-04-16
  Administered 2024-04-16: 40 mg via INTRAVENOUS

## 2024-04-16 MED ORDER — FAMOTIDINE (PF) 20 MG/2 ML INTRAVENOUS SOLUTION
INTRAVENOUS | Status: AC
Start: 2024-04-16 — End: 2024-04-16
  Filled 2024-04-16: qty 2

## 2024-04-16 MED ORDER — FAMOTIDINE (PF) 20 MG/2 ML INTRAVENOUS SOLUTION
40.0000 mg | INTRAVENOUS | Status: AC
Start: 2024-04-16 — End: 2024-04-16
  Administered 2024-04-16: 40 mg via INTRAVENOUS

## 2024-04-16 MED ORDER — PANTOPRAZOLE 40 MG TABLET,DELAYED RELEASE
40.0000 mg | DELAYED_RELEASE_TABLET | Freq: Two times a day (BID) | ORAL | 0 refills | Status: AC
Start: 2024-04-16 — End: 2024-05-16

## 2024-04-16 NOTE — ED Nurses Note (Signed)
 Provider in room going over results, medications and plan for discharge.

## 2024-04-16 NOTE — ED Provider Notes (Signed)
 [facility], [place] - Emergency Department  ED Primary Note  History of Present Illness   Sabrina Morgan is a 28 y.o. female who had concerns including Vomiting. Nv x multiple for 4 days states has not had zepbound in a week. States she has tried Zofran  numerous doses.   Review of Systems   Constitutional: No fever, chills or weakness   Skin: No rash or diaphoresis  HENT: No headaches, or congestion  Eyes: No vision changes or photophobia   Cardio: No chest pain, palpitations or leg swelling   Respiratory: No cough, wheezing or SOB  GI:  +nausea, vomiting  no pain no stool changes  GU:  No dysuria, hematuria, or increased frequency  MSK: No muscle aches, joint or back pain  Neuro: No seizures, LOC, numbness, tingling, or focal weakness+ dizzy  Psychiatric: No depression, SI or substance abuse  All other systems reviewed and are negative.      Physical Exam   ED Triage Vitals [04/16/24 1336]   BP (Non-Invasive) (!) 143/93   Heart Rate 83   Respiratory Rate 16   Temperature 36.7 C (98 F)   SpO2 97 %   Weight 106 kg (233 lb)   Height 1.829 m (6')     Constitutional:  28 y.o. female who appears in no distress. Normal color, no cyanosis.   HENT:   Head: Normocephalic and atraumatic.   Mouth/Throat: Oropharynx is clear and moist.   Eyes: EOMI, PERRL   Neck: Trachea midline. Neck supple.  Cardiovascular: RRR, No murmurs, rubs or gallops. Intact distal pulses.  Pulmonary/Chest: BS equal bilaterally. No respiratory distress. No wheezes, rales or chest tenderness.   Abdominal: Bowel sounds present and normal. Abdomen soft, no tenderness, no rebound and no guarding.  Back: No midline spinal tenderness, no paraspinal tenderness, no CVA tenderness.           Musculoskeletal: No edema, tenderness or deformity.  Skin: warm and dry. No rash, erythema, pallor or cyanosis  Psychiatric: normal mood and affect. Behavior is normal.   Neurological: Patient keenly alert and responsive, easily able to raise  eyebrows, facial muscles/expressions symmetric, speaking in fluent sentences, moving all extremities equally and fully, normal gait  Patient Data   Labs Ordered/Reviewed   COMPREHENSIVE METABOLIC PANEL, NON-FASTING - Abnormal; Notable for the following components:       Result Value    GLUCOSE 113 (*)     AST (SGOT) 9 (*)     All other components within normal limits    Narrative:     Estimated Glomerular Filtration Rate (eGFR) is calculated using the CKD-EPI (2021) equation, intended for patients 48 years of age and older. If gender is not documented or unknown, there will be no eGFR calculation.   CBC WITH DIFF - Abnormal; Notable for the following components:    MPV 7.3 (*)     NEUTROPHIL % 80 (*)     LYMPHOCYTE % 15 (*)     EOSINOPHIL % 0 (*)     All other components within normal limits   URINALYSIS, MACRO/MICRO - Abnormal; Notable for the following components:    APPEARANCE Slightly Hazy (*)     LEUKOCYTES Trace (*)     BLOOD Trace (*)     All other components within normal limits   URINALYSIS, MICROSCOPIC - Abnormal; Notable for the following components:    BACTERIA Few (*)     MUCOUS Moderate (*)     WBCS 6-10 (*)  All other components within normal limits   LIPASE - Normal   MAGNESIUM - Normal   HCG QUALITATIVE PREGNANCY, SERUM - Normal    Narrative:     [id number]  08/21/25   URINE CULTURE,ROUTINE   CBC/DIFF    Narrative:     The following orders were created for panel order CBC/DIFF.  Procedure                               Abnormality         Status                     ---------                               -----------         ------                     CBC WITH IPQQ[[id number]]                Abnormal            Final result                 Please view results for these tests on the individual orders.   URINALYSIS WITH REFLEX MICROSCOPIC AND CULTURE IF POSITIVE    Narrative:     The following orders were created for panel order URINALYSIS WITH REFLEX MICROSCOPIC AND CULTURE IF POSITIVE.  Procedure                                Abnormality         Status                     ---------                               -----------         ------                     URINALYSIS, MACRO/MICRO[[id number]]      Abnormal            Final result               URINALYSIS, MICROSCOPIC[[id number]]      Abnormal            Final result                 Please view results for these tests on the individual orders.     No orders to display     Medical Decision Making   Diff dx of dehydration gastritis.   No concerning labs . Pt had no vd in er but still felt nauseated. Further meds ordered  Medications Ordered/Administered in the ED   NS flush syringe (0 mL Intracatheter Not Given 04/16/24 1500)   NS flush syringe (has no administration in time range)   NS bolus infusion 1,000 mL (1,000 mL Intravenous New Bag/New Syringe 04/16/24 1415)   ondansetron  (ZOFRAN ) 2 mg/mL injection (has no administration in time range)   famotidine  (PEPCID ) 10 mg/mL injection (has no administration in time range)   prochlorperazine  (COMPAZINE ) 5 mg/mL injection (5 mg  Intravenous Given 04/16/24 1415)   pantoprazole  (PROTONIX ) 4 mg/mL injection (40 mg Intravenous Given 04/16/24 1415)     Clinical Impression   Nausea and vomiting, unspecified vomiting type (Primary)   Acute gastritis without hemorrhage, unspecified gastritis type   UTI (urinary tract infection)       Disposition: Discharged

## 2024-04-16 NOTE — ED Nurses Note (Addendum)
Patient discharged home all instructions gone over with patient including medications with opportunity to ask questions. Patient verbalized understanding and ambulated off unit independently with mother.

## 2024-04-16 NOTE — ED Nurses Note (Signed)
 Patient continues to complain of dry heaves provider aware.

## 2024-04-18 LAB — URINE CULTURE,ROUTINE: URINE CULTURE: NO GROWTH

## 2024-07-08 ENCOUNTER — Ambulatory Visit: Payer: Self-pay | Attending: OTOLARYNGOLOGY | Admitting: OTOLARYNGOLOGY

## 2024-07-08 ENCOUNTER — Other Ambulatory Visit: Payer: Self-pay

## 2024-07-08 ENCOUNTER — Encounter (INDEPENDENT_AMBULATORY_CARE_PROVIDER_SITE_OTHER): Payer: Self-pay | Admitting: OTOLARYNGOLOGY

## 2024-07-08 VITALS — Ht 72.0 in | Wt 233.0 lb

## 2024-07-08 DIAGNOSIS — H9203 Otalgia, bilateral: Secondary | ICD-10-CM | POA: Insufficient documentation

## 2024-07-08 DIAGNOSIS — H938X3 Other specified disorders of ear, bilateral: Secondary | ICD-10-CM

## 2024-07-08 DIAGNOSIS — J342 Deviated nasal septum: Secondary | ICD-10-CM | POA: Insufficient documentation

## 2024-07-08 DIAGNOSIS — H6993 Unspecified Eustachian tube disorder, bilateral: Secondary | ICD-10-CM | POA: Insufficient documentation

## 2024-07-08 DIAGNOSIS — M26603 Bilateral temporomandibular joint disorder, unspecified: Secondary | ICD-10-CM

## 2024-07-08 DIAGNOSIS — H9190 Unspecified hearing loss, unspecified ear: Secondary | ICD-10-CM | POA: Insufficient documentation

## 2024-07-08 DIAGNOSIS — Z8669 Personal history of other diseases of the nervous system and sense organs: Secondary | ICD-10-CM

## 2024-07-08 MED ORDER — AZELASTINE 137 MCG (0.1 %) NASAL SPRAY
2.0000 | Freq: Two times a day (BID) | NASAL | 11 refills | Status: AC
Start: 2024-07-08 — End: 2024-08-07

## 2024-07-08 NOTE — H&P (Signed)
 ENT, [facility]  [address]  [place] [address]  Operated by [facility]  History and Physical    Name: Sabrina Morgan MRN:  [id number]   Date: 07/08/2024 DOB:  [birth year] (28 y.o.)            New Patient      Chief Complaint:    Chief Complaint   Patient presents with    Ear Problem(s)     Complains of persistent ear infections since July. States left is hurting worse than right at this time. States having fullness in both ears.        HPI:  Sabrina Morgan is a 28 y.o. female presenting for a new patient visit. Patient states that she was having left sided otalgia.  Patient has been seen by PCP and told she has ear infections since July.  She denies any fever/chills. She has been having some hearing loss and aural fullness.     Past Medical History:   Diagnosis Date    Gallbladder disease     Nonalcoholic hepatosteatosis     Obesity, unspecified     Prediabetes     Primary hypertension 12/23/2013    Seizure (CMS HCC)          Past Surgical History:   Procedure Laterality Date    HX CHOLECYSTECTOMY      HX KNEE SURGERY Right     3 arthroscopies    HX LAP CHOLECYSTECTOMY      HX TONSILLECTOMY      HX TYMPANOSTOMY      HX UPPER ENDOSCOPY      SLEEVE GASTROPLASTY           Family Medical History:       Problem Relation (Age of Onset)    Diabetes Father, Paternal Grandfather, Paternal Aunt, General Family Hx, Other    Digestive problems Mother    Healthy Brother    Obesity Mother    Other Mother, Father            Social History[1]     Medications:  Current Outpatient Medications   Medication Sig    azelastine (ASTELIN) 137 mcg (0.1 %) Nasal Spray, Non-Aerosol Administer 2 Sprays into each nostril Twice daily for 30 days Use in each nostril as directed    LORazepam  (ATIVAN ) 0.5 mg Oral Tablet Take 1 Tablet (0.5 mg total) by mouth Twice per day as needed for Anxiety    ZEPBOUND 2.5 mg/0.5 mL Subcutaneous Pen Injector Inject 0.5 mL (2.5 mg total) under the skin Every 7 days        Allergies:  Allergies[2]    Review of Systems:  Review of Systems   Constitutional: Negative.         Physical Exam:  Vitals:    07/08/24 0958   Weight: 106 kg (233 lb)   Height: 1.829 m (6')   BMI: 31.6      ENT Physical Exam  Constitutional  Appearance: patient appears well-developed, well-nourished and well-groomed,  Communication/Voice: communication appropriate for developmental age; vocal quality normal;  Head and Face  Appearance: head appears normal, face appears normal and face appears atraumatic;  Palpation: TMJ tender bilaterally;  Salivary: glands normal;  Ear  Hearing: intact;  Auricles: right auricle normal; left auricle normal;  External Mastoids: right external mastoid normal; left external mastoid normal;  Ear Canals: right ear canal normal; left ear canal normal;  Tympanic Membranes: right tympanic membrane normal; left tympanic membrane normal;  Nose  External Nose: nares patent bilaterally; external nose normal;  Internal Nose: nasal mucosa normal; septum normal; bilateral inferior turbinates normal;  Oral Cavity/Oropharynx  Lips: normal;  Teeth: normal;  Gums: gingiva normal;  Tongue: normal;  Oral mucosa: normal;  Hard palate: normal;  Neck  Neck: neck normal; neck palpation normal;  Thyroid : thyroid  normal;  Respiratory  Inspection: breathing unlabored; normal breathing rate;  Lymphatic  Palpation: lymph nodes normal;  Neurovestibular  Mental Status: alert and oriented;  Psychiatric: mood normal; affect is appropriate;  Cranial Nerves: cranial nerves intact;       Assessment and Plan:    ICD-10-CM    1. Otalgia of both ears  H92.03 31231 - NASAL ENDOSCOPY DIAGNOSTIC UNILATERAL OR BILATERAL (AMB ONLY)      2. Hearing loss  H91.90       3. DNS (deviated nasal septum)  J34.2       4. ETD (Eustachian tube dysfunction), bilateral  H69.93 POCT HEARING/VISION/TYMPANOGRAM (AMB ONLY)     [facility]        Orders Placed This Encounter    [phone] - NASAL  ENDOSCOPY DIAGNOSTIC UNILATERAL OR BILATERAL (AMB ONLY)    [facility]    POCT HEARING/VISION/TYMPANOGRAM (AMB ONLY)    azelastine (ASTELIN) 137 mcg (0.1 %) Nasal Spray, Non-Aerosol   Referral note reviewed  Will check audiogram  Tymps type A Au and no signs of infection at this time.     Will start astelin  TMJ precautions discussed.        Follow Up:  Return for Follow up after audiogram.     Oneil Arch, DO         [1]   Social History  Tobacco Use    Smoking status: Never    Smokeless tobacco: Never   Vaping Use    Vaping status: Never Used   Substance Use Topics    Alcohol use: Not Currently     Comment: rare    Drug use: Never   [2]   Allergies  Allergen Reactions    Latex Rash    Dexamethasone Acetate Diarrhea, Nausea/ Vomiting and Hives/ Urticaria    Dexamethasone Sodium Phosphate Rash, Diarrhea and Nausea/ Vomiting    Macrobid  [Nitrofurantoin  Monohyd/M-Cryst] Anaphylaxis    Tape [Adhesive]

## 2024-07-08 NOTE — Procedures (Signed)
 [facility]  [address]  [place] [address]  Operated by [facility]  Procedure Note    Name: Sabrina Morgan MRN:  [id number]   Date: 07/08/2024 DOB:  [birth year] (28 y.o.)         31231 - NASAL ENDOSCOPY DIAGNOSTIC UNILATERAL OR BILATERAL (AMB ONLY)    Performed by: Margean Anes, DO  Authorized by: Margean Anes, DO    Time Out:     Immediately before the procedure, a time out was called:  Yes    Patient verified:  Yes    Procedure Verified:  Yes    Site Verified:  Yes  Documentation:      [facility], [facility]  [address]  [place] [address]  Operated by [facility]  Procedure Note    Name: Sabrina Morgan MRN:  [id number]  Date: 07/08/2024 DOB:  June 16, 1996 (28 y.o.)        @PROCDOC @    Indications for procedure: Otalgia    Anesthesia: Oxymetazoline nasal spray    Description: Nasal endoscopy with rigid scope was performed with examination of the  septum, inferior, middle, and superior meatus, turbinates, sphenoethmoidal recess, and nasopharynx.     There were no polyps, pus, or granulation tissue noted.  ET orifices and nasopharynx were normal.     Findings: Septal deviation, no masses or lesions    The patient tolerated the procedure well.    Anes Margean, DO             Shota Kohrs [place], DO

## 2024-10-09 ENCOUNTER — Ambulatory Visit (INDEPENDENT_AMBULATORY_CARE_PROVIDER_SITE_OTHER): Payer: Self-pay | Admitting: OTOLARYNGOLOGY
